# Patient Record
Sex: Female | Born: 1995 | Race: White | Hispanic: No | Marital: Single | Smoking: Never smoker
Health system: Southern US, Community
[De-identification: ages and names within clinical notes are randomized; demographics above are authoritative.]

## PROBLEM LIST (undated history)

## (undated) DIAGNOSIS — Z789 Other specified health status: Secondary | ICD-10-CM

## (undated) HISTORY — DX: Other specified health status: Z78.9

## (undated) HISTORY — PX: NO PAST SURGERIES: SHX2092

---

## 2020-02-15 NOTE — L&D Delivery Note (Signed)
Date of delivery: 10/17/2020 Estimated Date of Delivery: 10/31/20 Patient's last menstrual period was 01/25/2020. EGA: [redacted]w[redacted]d  Delivery Note At 9:17 AM a viable female was delivered via Vaginal, Spontaneous  Presentation:   Occiput Anterior, ROA APGAR: 8, 9;   Weight: 4030 g, 8 pounds 14 ounces Placenta status: Spontaneous, Intact.   Cord: 3 vessels with the following complications: None.  Cord pH: NA  Called to see patient.  Mom pushed to deliver a viable female infant.  The head followed by shoulders, which delivered without difficulty, and the rest of the body.  No nuchal cord noted.  Baby to mom's chest.  Cord clamped and cut after 3 min delay.  Cord blood obtained.  Placenta delivered spontaneously, intact, with a 3-vessel cord.  Second degree perineal laceration repaired with 3-0 Vicryl in standard fashion.  All counts correct.  Hemostasis obtained with IV pitocin and fundal massage.    Anesthesia: Epidural Episiotomy: None Lacerations: 2nd degree Suture Repair: 3.0 vicryl Est. Blood Loss (mL): 300  Mom to postpartum.  Baby to Couplet care / Skin to Skin.  Tresea Mall, CNM 10/17/2020, 10:08 AM

## 2020-04-21 ENCOUNTER — Ambulatory Visit (INDEPENDENT_AMBULATORY_CARE_PROVIDER_SITE_OTHER): Payer: Medicaid Other | Admitting: Obstetrics and Gynecology

## 2020-04-21 ENCOUNTER — Other Ambulatory Visit: Payer: Self-pay

## 2020-04-21 ENCOUNTER — Encounter: Payer: Self-pay | Admitting: Obstetrics and Gynecology

## 2020-04-21 VITALS — BP 120/70 | Ht 64.0 in | Wt 154.4 lb

## 2020-04-21 DIAGNOSIS — N760 Acute vaginitis: Secondary | ICD-10-CM | POA: Diagnosis not present

## 2020-04-21 DIAGNOSIS — R875 Abnormal microbiological findings in specimens from female genital organs: Secondary | ICD-10-CM | POA: Diagnosis not present

## 2020-04-21 DIAGNOSIS — Z3401 Encounter for supervision of normal first pregnancy, first trimester: Secondary | ICD-10-CM | POA: Diagnosis not present

## 2020-04-21 DIAGNOSIS — Z1379 Encounter for other screening for genetic and chromosomal anomalies: Secondary | ICD-10-CM

## 2020-04-21 DIAGNOSIS — Z3403 Encounter for supervision of normal first pregnancy, third trimester: Secondary | ICD-10-CM

## 2020-04-21 DIAGNOSIS — N926 Irregular menstruation, unspecified: Secondary | ICD-10-CM

## 2020-04-21 HISTORY — DX: Encounter for supervision of normal first pregnancy, third trimester: Z34.03

## 2020-04-21 LAB — POCT URINALYSIS DIPSTICK OB
Glucose, UA: NEGATIVE
POC,PROTEIN,UA: NEGATIVE

## 2020-04-21 LAB — POCT URINE PREGNANCY: Preg Test, Ur: POSITIVE — AB

## 2020-04-21 NOTE — Patient Instructions (Addendum)
Obstetrics: Normal and Problem Pregnancies (7th ed., pp. 102-121). [place], [place]: Elsevier."> Textbook of Family Medicine (9th ed., pp. [phone]). [place], [place]: Elsevier [employer].">  First Trimester of Pregnancy  The first trimester of pregnancy starts on the first day of your last menstrual period until the end of week 12. This is months 1 through 3 of pregnancy. A week after a sperm fertilizes an egg, the egg will implant into the wall of the uterus and begin to develop into a baby. By the end of 12 weeks, all the baby's organs will be formed and the baby will be 2-3 inches in size. Body changes during your first trimester Your body goes through many changes during pregnancy. The changes vary and generally return to normal after your baby is born. Physical changes  You may gain or lose weight.  Your breasts may begin to grow larger and become tender. The tissue that surrounds your nipples (areola) may become darker.  Dark spots or blotches (chloasma or mask of pregnancy) may develop on your face.  You may have changes in your hair. These can include thickening or thinning of your hair or changes in texture. Health changes  You may feel nauseous, and you may vomit.  You may have heartburn.  You may develop headaches.  You may develop constipation.  Your gums may bleed and may be sensitive to brushing and flossing. Other changes  You may tire easily.  You may urinate more often.  Your menstrual periods will stop.  You may have a loss of appetite.  You may develop cravings for certain kinds of food.  You may have changes in your emotions from day to day.  You may have more vivid and strange dreams. Follow these instructions at home: Medicines  Follow your health care provider's instructions regarding medicine use. Specific medicines may be either safe or unsafe to take during pregnancy. Do not take any medicines unless told to by your health care provider.  Take a  prenatal vitamin that contains at least 600 micrograms (mcg) of folic acid. Eating and drinking  Eat a healthy diet that includes fresh fruits and vegetables, whole grains, good sources of protein such as meat, eggs, or tofu, and low-fat dairy products.  Avoid raw meat and unpasteurized juice, milk, and cheese. These carry germs that can harm you and your baby.  If you feel nauseous or you vomit: ? Eat 4 or 5 small meals a day instead of 3 large meals. ? Try eating a few soda crackers. ? Drink liquids between meals instead of during meals.  You may need to take these actions to prevent or treat constipation: ? Drink enough fluid to keep your urine pale yellow. ? Eat foods that are high in fiber, such as beans, whole grains, and fresh fruits and vegetables. ? Limit foods that are high in fat and processed sugars, such as fried or sweet foods. Activity  Exercise only as directed by your health care provider. Most people can continue their usual exercise routine during pregnancy. Try to exercise for 30 minutes at least 5 days a week.  Stop exercising if you develop pain or cramping in the lower abdomen or lower back.  Avoid exercising if it is very hot or humid or if you are at high altitude.  Avoid heavy lifting.  If you choose to, you may have sex unless your health care provider tells you not to. Relieving pain and discomfort  Wear a good support bra to relieve breast  tenderness.  Rest with your legs elevated if you have leg cramps or low back pain.  If you develop bulging veins (varicose veins) in your legs: ? Wear support hose as told by your health care provider. ? Elevate your feet for 15 minutes, 3-4 times a day. ? Limit salt in your diet. Safety  Wear your seat belt at all times when driving or riding in a car.  Talk with your health care provider if someone is verbally or physically abusive to you.  Talk with your health care provider if you are feeling sad or have  thoughts of hurting yourself. Lifestyle  Do not use hot tubs, steam rooms, or saunas.  Do not douche. Do not use tampons or scented sanitary pads.  Do not use herbal remedies, alcohol, illegal drugs, or medicines that are not approved by your health care provider. Chemicals in these products can harm your baby.  Do not use any products that contain nicotine or tobacco, such as cigarettes, e-cigarettes, and chewing tobacco. If you need help quitting, ask your health care provider.  Avoid cat litter boxes and soil used by cats. These carry germs that can cause birth defects in the baby and possibly loss of the unborn baby (fetus) by miscarriage or stillbirth. General instructions  During routine prenatal visits in the first trimester, your health care provider will do a physical exam, perform necessary tests, and ask you how things are going. Keep all follow-up visits. This is important.  Ask for help if you have counseling or nutritional needs during pregnancy. Your health care provider can offer advice or refer you to specialists for help with various needs.  Schedule a dentist appointment. At home, brush your teeth with a soft toothbrush. Floss gently.  Write down your questions. Take them to your prenatal visits. Where to find more information  American Pregnancy Association: americanpregnancy.org  [employer]: https://www.todd-brady.net/  Office on [employer] [facility]: MightyReward.co.nz Contact a health care provider if you have:  Dizziness.  A fever.  Mild pelvic cramps, pelvic pressure, or nagging pain in the abdominal area.  Nausea, vomiting, or diarrhea that lasts for 24 hours or longer.  A bad-smelling vaginal discharge.  Pain when you urinate.  Known exposure to a contagious illness, such as chickenpox, measles, Zika virus, HIV, or hepatitis. Get help right away if you have:  Spotting or bleeding from your  vagina.  Severe abdominal cramping or pain.  Shortness of breath or chest pain.  Any kind of trauma, such as from a fall or a car crash.  New or increased pain, swelling, or redness in an arm or leg. Summary  The first trimester of pregnancy starts on the first day of your last menstrual period until the end of week 12 (months 1 through 3).  Eating 4 or 5 small meals a day rather than 3 large meals may help to relieve nausea and vomiting.  Do not use any products that contain nicotine or tobacco, such as cigarettes, e-cigarettes, and chewing tobacco. If you need help quitting, ask your health care provider.  Keep all follow-up visits. This is important. This information is not intended to replace advice given to you by your health care provider. Make sure you discuss any questions you have with your health care provider. Document Revised: 07/10/2019 Document Reviewed: 05/16/2019 Elsevier Patient Education  2021 Elsevier Inc.    Initial steps to help with nausea and vomiting in pregnancy:   B6 (pyridoxine) 25 mg,  3-4 times a day- 200 mg a day total Unisom (doxylamine) 25 mg at bedtime **B6 and Unisom are available as a combination prescription medications called diclegis and bonjesta  B1 (thiamin)  50-100 mg 1-2 a day-  100 mg a day total  Continue prenatal vitamin with iron and thiamin. If it is not tolerated switch to 1 mg of folic acid.  Can add medication for gastric reflux if needed.  Subsequent steps to be added to B1, B6, and Unisom:  1. Antihistamine (one of the following medications) Dramamine      25-50 mg every 4-6 hours Benadryl      25-50 mg every 4-6 hours Meclizine      25 mg every 6 hours  2. Dopamine Antagonist (one of the following medications) Metoclopramide  (Reglan)  5-10 mg every 6-8 hours         PO Promethazine   (Phenergan)   12.5-25 mg every 4-6 hours      PO or rectal Prochlorperazine  (Compazine)  5-10 mg every 6-8 hours     25mg  BID rectally    Subsequent steps if there has still not been improvement in symptoms:  3. Daily stool softner:  Colace 100 mg twice a day  4. Ondansetron  (Zofran)   4-8 mg every 6-8 hours     Hyperemesis Gravidarum Hyperemesis gravidarum is a severe form of nausea and vomiting that happens during pregnancy. Hyperemesis is worse than morning sickness. It may cause you to have nausea or vomiting all day for many days. It may keep you from eating and drinking enough food and liquids, which can lead to dehydration, malnutrition, and weight loss. Hyperemesis usually occurs during the first half (the first 20 weeks) of pregnancy. It often goes away once a woman is in her second half of pregnancy. However, sometimes hyperemesis continues through an entire pregnancy. What are the causes? The cause of this condition is not known. It may be associated with:  Changes in hormones in the body during pregnancy.  Changes in the gastrointestinal system.  Genetic or inherited conditions. What are the signs or symptoms? Symptoms of this condition include:  Severe nausea and vomiting that does not go away.  Problems keeping food down.  Weight loss.  Loss of body fluid (dehydration).  Loss of appetite. You may have no desire to eat or you may not like the food you have previously enjoyed. How is this diagnosed? This condition may be diagnosed based on your medical history, your symptoms, and a physical exam. You may also have other tests, including:  Blood tests.  Urine tests.  Blood pressure tests.  Ultrasound to look for problems with the placenta or to check if you are pregnant with more than one baby. How is this treated? This condition is managed by controlling symptoms. This may include:  Following an eating plan. This can help to lessen nausea and vomiting.  Treatments that do not use medicine. These include acupressure bracelets, hypnosis, and eating or drinking foods or fluids that contain  ginger, ginger ale, or ginger tea.  Taking prescription medicine or over-the-counter medicine as told by your health care provider.  Continuing to take prenatal vitamins. You may need to change what kind you take and when you take them. Follow your health care provider's instructions about prenatal vitamins. An eating plan and medicines are often used together to help control symptoms. If medicines do not help relieve nausea and vomiting, you may need to receive fluids through  an IV at the hospital. Follow these instructions at home: To help relieve your symptoms, listen to your body. Everyone is different and has different preferences. Find what works best for you. Here are some things you can try to help relieve your symptoms: Meals and snacks  Eat 5-6 small meals daily instead of 3 large meals. Eating small meals and snacks can help you avoid an empty stomach.  Before getting out of bed, eat a couple of crackers to avoid moving around on an empty stomach.  Eat a protein-rich snack before bed. Examples include cheese and crackers, or a peanut butter sandwich made with 1 slice of whole-wheat bread and 1 tsp (5 g) of peanut butter.  Eat and drink slowly.  Try eating starchy foods as these are usually tolerated well. Examples include cereal, toast, bread, potatoes, pasta, rice, and pretzels.  Eat at least one serving of protein with your meals and snacks. Protein options include lean meats, poultry, seafood, beans, nuts, nut butters, eggs, cheese, and yogurt.  Eat or suck on things that have ginger in them. It may help to relieve nausea. Add  tsp (0.44 g) ground ginger to hot tea, or choose ginger tea.   Fluids It is important to stay hydrated. Try to:  Drink small amounts of fluids often.  Drink fluids 30 minutes before or after a meal to help lessen the feeling of a full stomach.  Drink 100% fruit juice or an electrolyte drink. An electrolyte drink contains sodium, potassium, and  chloride.  Drink fluids that are cold, clear, and carbonated or sour. These include lemonade, ginger ale, lemon-lime soda, ice water, and sparkling water. Things to avoid Avoid the following:  Eating foods that trigger your symptoms. These may include spicy foods, coffee, high-fat foods, very sweet foods, and acidic foods.  Drinking more than 1 cup of fluid at a time.  Skipping meals. Nausea can be more intense on an empty stomach. If you cannot tolerate food, do not force it. Try sucking on ice chips or other frozen items and make up for missed calories later.  Lying down within 2 hours after eating.  Being exposed to environmental triggers. These may include food smells, smoky rooms, closed spaces, rooms with strong smells, warm or humid places, overly loud and noisy rooms, and rooms with motion or flickering lights. Try eating meals in a well-ventilated area that is free of strong smells.  Making quick and sudden changes in your movement.  Taking iron pills and multivitamins that contain iron. If you take prescription iron pills, do not stop taking them unless your health care provider approves.  Preparing food. The smell of food can spoil your appetite or trigger nausea. General instructions  Brush your teeth or use a mouth rinse after meals.  Take over-the-counter and prescription medicines only as told by your health care provider.  Follow instructions from your health care provider about eating or drinking restrictions.  Talk with your health care provider about starting a supplement of vitamin B6.  Continue to take your prenatal vitamins as told by your health care provider. If you are having trouble taking your prenatal vitamins, talk with your health care provider about other options.  Keep all follow-up visits. This is important. Follow-up visits include prenatal visits. Contact a health care provider if:  You have pain in your abdomen.  You have a severe  headache.  You have vision problems.  You are losing weight.  You feel weak or dizzy.  You cannot eat or drink without vomiting, especially if this goes on for a full day. Get help right away if:  You cannot drink fluids without vomiting.  You vomit blood.  You have constant nausea and vomiting.  You are very weak.  You faint.  You have a fever and your symptoms suddenly get worse. Summary  Hyperemesis gravidarum is a severe form of nausea and vomiting that happens during pregnancy.  Making some changes to your eating habits may help relieve nausea and vomiting.  This condition may be managed with lifestyle changes and medicines as prescribed by your health care provider.  If medicines do not help relieve nausea and vomiting, you may need to receive fluids through an IV at the hospital. This information is not intended to replace advice given to you by your health care provider. Make sure you discuss any questions you have with your health care provider. Document Revised: 08/26/2019 Document Reviewed: 08/26/2019 Elsevier Patient Education  2021 [employer]

## 2020-04-21 NOTE — Progress Notes (Signed)
04/21/2020   Chief Complaint: Missed period  Transfer of Care Patient: no  History of Present Illness: Vanessa Rodgers is a 25 y.o. [name] [redacted]w[redacted]d based on Patient's last menstrual period was 01/25/2020. with an Estimated Date of Delivery: 10/31/20, with the above CC.   Her periods were: regular periods everynomth She was using no method when she conceived.  She has Positive signs or symptoms of nausea/vomiting of pregnancy. She has Negative signs or symptoms of miscarriage or preterm labor She was not taking different medications around the time she conceived/early pregnancy. Since her LMP, she has not used alcohol Since her LMP, she has not used tobacco products Since her LMP, she has not used illegal drugs.    Current or past history of domestic violence. no  Infection History:  1. Since her LMP, she has had a viral illness.  Covid in early Jan 2022 2. She denies close contact with children on a regular basis     3. She has a history of chicken pox. She reports vaccination for chicken pox in the past. 4. Patient or partner has history of genital herpes  no 5. History of STI (GC, CT, HPV, syphilis, HIV)  no   6.  She does not live with someone with TB or TB exposed. 7. History of recent travel :  no 8. She identifies Negative Zika risk factors for her and her partner 51. There are not cats in the home in the home.  She understands that while pregnant she should not change cat litter.   Genetic Screening Questions: (Includes patient, baby's father, or anyone in either family)   1. Patient's age >/= 10 at [facility]  no 2. Thalassemia ([place], [place], Mediterranean, or Asian background): MCV<80  no 3. Neural tube defect (meningomyelocele, spina bifida, anencephaly)  no 4. Congenital heart defect  no  5. Down syndrome  yes 6. Tay-Sachs (Jewish, [place]  no 7. Canavan's Disease  no 8. Sickle cell disease or trait (African)  no  9. Hemophilia or other blood disorders  no  10. Muscular  dystrophy  no  11. Cystic fibrosis  no  12. Huntington's Chorea  no  13. Mental retardation/autism  no 14. Other inherited genetic or chromosomal disorder  no 15. Maternal metabolic disorder (DM, PKU, etc)  no 16. Patient or FOB with a child with a birth defect not listed above no  16a. Patient or FOB with a birth defect themselves no 17. Recurrent pregnancy loss, or stillbirth  no  18. Any medications since LMP other than prenatal vitamins (include vitamins, supplements, OTC meds, drugs, alcohol)  no 19. Any other genetic/environmental exposure to discuss  no  ROS:  ROS  OBGYN History: As per HPI. OB History  Gravida Para Term Preterm AB Living  1            SAB IAB Ectopic Multiple Live Births               # Outcome Date GA Lbr Len/2nd Weight Sex Delivery Anes PTL Lv  1 Current             Any issues with any prior pregnancies: not applicable Any prior children are healthy, doing well, without any problems or issues: not applicable Last pap smear 02/21/2019- ASCUS HPV+, then 02/20/2020 LSIL  History of STIs: No   Past Medical History: History reviewed. No pertinent past medical history.  Past Surgical History: History reviewed. No pertinent surgical history.  Family History:  Family History  Problem Relation Age of Onset  . Angina Father    She denies any female cancers, bleeding or blood clotting disorders.   Social History:  Social History   Socioeconomic History  . Marital status: Single    Spouse name: Not on file  . Number of children: Not on file  . Years of education: Not on file  . Highest education level: Not on file  Occupational History  . Not on file  Tobacco Use  . Smoking status: Never Smoker  . Smokeless tobacco: Never Used  Vaping Use  . Vaping Use: Never used  Substance and Sexual Activity  . Alcohol use: Not Currently  . Drug use: Never  . Sexual activity: Yes    Partners: Male  Other Topics Concern  . Not on file  Social History  Narrative  . Not on file   Social Determinants of Health   Financial Resource Strain: Not on file  Food Insecurity: Not on file  Transportation Needs: Not on file  Physical Activity: Not on file  Stress: Not on file  Social Connections: Not on file  Intimate Partner Violence: Not on file    Allergy: No Known Allergies  Current Outpatient Medications: No current outpatient medications on file.   Physical Exam: Physical Exam   Assessment: Vanessa Rodgers is a 25 y.o. [name] [redacted]w[redacted]d based on Patient's last menstrual period was 01/25/2020. with an Estimated Date of Delivery: 10/31/20,  for prenatal care.  Plan:  1) Avoid alcoholic beverages. 2) Patient encouraged not to smoke.  3) Discontinue the use of all non-medicinal drugs and chemicals.  4) Take prenatal vitamins daily.  5) Seatbelt use advised 6) Nutrition, food safety (fish, cheese advisories, and high nitrite foods) and exercise discussed. 7) Hospital and practice style delivering at [facility] discussed  8) Patient is asked about travel to areas at risk for the Zika virus, and counseled to avoid travel and exposure to mosquitoes or sexual partners who may have themselves been exposed to the virus. Testing is discussed, and will be ordered as appropriate.  9) Childbirth classes at [facility] advised 10) Genetic Screening, such as with 1st Trimester Screening, cell free fetal DNA, AFP testing, and Ultrasound, as well as with amniocentesis and CVS as appropriate, is discussed with patient. She plans to have genetic testing this pregnancy.  Bedside abdominal US today confirmed IUP with + FHT  Problem list reviewed and updated.  I discussed the assessment and treatment plan with the patient. The patient was provided an opportunity to ask questions and all were answered. The patient agreed with the plan and demonstrated an understanding of the instructions.  Adelene Idler MD [facility], [facility] 04/21/2020 10:46  AM

## 2020-04-22 LAB — HEPATITIS C ANTIBODY: Hep C Virus Ab: <0.1 s/co ratio (ref 0.0–0.9)

## 2020-04-22 LAB — RPR+RH+ABO+RUB AB+AB SCR+CB...
Antibody Screen: NEGATIVE
HIV Screen 4th Generation wRfx: NONREACTIVE
Hematocrit: 38.1 % (ref 34.0–46.6)
Hemoglobin: 13.3 g/dL (ref 11.1–15.9)
Hepatitis B Surface Ag: NEGATIVE
MCH: 33 pg (ref 26.6–33.0)
MCHC: 34.9 g/dL (ref 31.5–35.7)
MCV: 95 fL (ref 79–97)
Platelets: 228 10*3/uL (ref 150–450)
RBC: 4.03 x10E6/uL (ref 3.77–5.28)
RDW: 12.4 % (ref 11.7–15.4)
RPR Ser Ql: NONREACTIVE
Rh Factor: NEGATIVE
Rubella Antibodies, IGG: <0.9 index — ABNORMAL LOW (ref >0.99)
Varicella zoster IgG: <135 index — ABNORMAL LOW (ref >165)
WBC: 6.2 10*3/uL (ref 3.4–10.8)

## 2020-04-22 LAB — MONITOR DRUG PROFILE 10(MW)
Amphetamine Scrn, Ur: NEGATIVE ng/mL
BARBITURATE SCREEN URINE: NEGATIVE ng/mL
BENZODIAZEPINE SCREEN, URINE: NEGATIVE ng/mL
CANNABINOIDS UR QL SCN: NEGATIVE ng/mL
Cocaine (Metab) Scrn, Ur: NEGATIVE ng/mL
Creatinine(Crt), U: 239.3 mg/dL (ref 20.0–300.0)
Methadone Screen, Urine: NEGATIVE ng/mL
OXYCODONE+OXYMORPHONE UR QL SCN: NEGATIVE ng/mL
Opiate Scrn, Ur: NEGATIVE ng/mL
Ph of Urine: 6.7 (ref 4.5–8.9)
Phencyclidine Qn, Ur: NEGATIVE ng/mL
Propoxyphene Scrn, Ur: NEGATIVE ng/mL

## 2020-04-24 LAB — NUSWAB VAGINITIS PLUS (VG+)
Candida albicans, NAA: NEGATIVE
Candida glabrata, NAA: NEGATIVE
Chlamydia trachomatis, NAA: NEGATIVE
Megasphaera 1: HIGH Score — AB
Neisseria gonorrhoeae, NAA: NEGATIVE
Trich vag by NAA: NEGATIVE

## 2020-04-24 LAB — URINE CULTURE

## 2020-04-26 LAB — MATERNIT21 PLUS CORE+SCA
Fetal Fraction: 8
Monosomy X (Turner Syndrome): NOT DETECTED
Result (T21): NEGATIVE
Trisomy 13 (Patau syndrome): NEGATIVE
Trisomy 18 (Edwards syndrome): NEGATIVE
Trisomy 21 (Down syndrome): NEGATIVE
XXX (Triple X Syndrome): NOT DETECTED
XXY (Klinefelter Syndrome): NOT DETECTED
XYY (Jacobs Syndrome): NOT DETECTED

## 2020-04-27 ENCOUNTER — Other Ambulatory Visit: Payer: Self-pay | Admitting: Obstetrics and Gynecology

## 2020-04-27 DIAGNOSIS — B9689 Other specified bacterial agents as the cause of diseases classified elsewhere: Secondary | ICD-10-CM

## 2020-04-27 DIAGNOSIS — N760 Acute vaginitis: Secondary | ICD-10-CM

## 2020-04-27 MED ORDER — METRONIDAZOLE 0.75 % VA GEL: 1.0000 | Freq: Every day | VAGINAL | 1 refills | Status: AC

## 2020-05-26 ENCOUNTER — Ambulatory Visit (INDEPENDENT_AMBULATORY_CARE_PROVIDER_SITE_OTHER): Payer: Medicaid Other | Admitting: Obstetrics & Gynecology

## 2020-05-26 ENCOUNTER — Encounter: Payer: Self-pay | Admitting: Obstetrics & Gynecology

## 2020-05-26 ENCOUNTER — Other Ambulatory Visit: Payer: Self-pay

## 2020-05-26 VITALS — BP 120/80 | Wt 160.0 lb

## 2020-05-26 DIAGNOSIS — Z3689 Encounter for other specified antenatal screening: Secondary | ICD-10-CM

## 2020-05-26 DIAGNOSIS — Z3402 Encounter for supervision of normal first pregnancy, second trimester: Secondary | ICD-10-CM

## 2020-05-26 DIAGNOSIS — Z3A17 17 weeks gestation of pregnancy: Secondary | ICD-10-CM

## 2020-05-26 LAB — POCT URINALYSIS DIPSTICK OB
Glucose, UA: NEGATIVE
POC,PROTEIN,UA: NEGATIVE

## 2020-05-26 NOTE — Addendum Note (Signed)
Addended by: Cornelius Moras D on: 05/26/2020 09:48 AM   Modules accepted: Orders

## 2020-05-26 NOTE — Patient Instructions (Signed)
Thank you for choosing [facility]. As part of our ongoing efforts to improve patient experience, we would appreciate your feedback. Please fill out the short survey that you will receive by mail or MyChart. Your opinion is important to us! -Dr Srihitha Tagliaferri  Second Trimester of Pregnancy  The second trimester of pregnancy is from week 13 through week 27. This is months 4 through 6 of pregnancy. The second trimester is often a time when you feel your best. Your body has adjusted to being pregnant, and you begin to feel better physically. During the second trimester:  Morning sickness has lessened or stopped completely.  You may have more energy.  You may have an increase in appetite. The second trimester is also a time when the unborn baby (fetus) is growing rapidly. At the end of the sixth month, the fetus may be up to 12 inches long and weigh about 1 pounds. You will likely begin to feel the baby move (quickening) between 16 and 20 weeks of pregnancy. Body changes during your second trimester Your body continues to go through many changes during your second trimester. The changes vary and generally return to normal after the baby is born. Physical changes  Your weight will continue to increase. You will notice your lower abdomen bulging out.  You may begin to get stretch marks on your hips, abdomen, and breasts.  Your breasts will continue to grow and to become tender.  Dark spots or blotches (chloasma or mask of pregnancy) may develop on your face.  A dark line from your belly button to the pubic area (linea nigra) may appear.  You may have changes in your hair. These can include thickening of your hair, rapid growth, and changes in texture. Some people also have hair loss during or after pregnancy, or hair that feels dry or thin. Health changes  You may develop headaches.  You may have heartburn.  You may develop constipation.  You may develop hemorrhoids or swollen, bulging  veins (varicose veins).  Your gums may bleed and may be sensitive to brushing and flossing.  You may urinate more often because the fetus is pressing on your bladder.  You may have back pain. This is caused by: ? Weight gain. ? Pregnancy hormones that are relaxing the joints in your pelvis. ? A shift in weight and the muscles that support your balance. Follow these instructions at home: Medicines  Follow your health care provider's instructions regarding medicine use. Specific medicines may be either safe or unsafe to take during pregnancy. Do not take any medicines unless approved by your health care provider.  Take a prenatal vitamin that contains at least 600 micrograms (mcg) of folic acid. Eating and drinking  Eat a healthy diet that includes fresh fruits and vegetables, whole grains, good sources of protein such as meat, eggs, or tofu, and low-fat dairy products.  Avoid raw meat and unpasteurized juice, milk, and cheese. These carry germs that can harm you and your baby.  You may need to take these actions to prevent or treat constipation: ? Drink enough fluid to keep your urine pale yellow. ? Eat foods that are high in fiber, such as beans, whole grains, and fresh fruits and vegetables. ? Limit foods that are high in fat and processed sugars, such as fried or sweet foods. Activity  Exercise only as directed by your health care provider. Most people can continue their usual exercise routine during pregnancy. Try to exercise for 30 minutes at least   5 days a week. Stop exercising if you develop contractions in your uterus.  Stop exercising if you develop pain or cramping in the lower abdomen or lower back.  Avoid exercising if it is very hot or humid or if you are at a high altitude.  Avoid heavy lifting.  If you choose to, you may have sex unless your health care provider tells you not to. Relieving pain and discomfort  Wear a supportive bra to prevent discomfort from  breast tenderness.  Take warm sitz baths to soothe any pain or discomfort caused by hemorrhoids. Use hemorrhoid cream if your health care provider approves.  Rest with your legs raised (elevated) if you have leg cramps or low back pain.  If you develop varicose veins: ? Wear support hose as told by your health care provider. ? Elevate your feet for 15 minutes, 3-4 times a day. ? Limit salt in your diet. Safety  Wear your seat belt at all times when driving or riding in a car.  Talk with your health care provider if someone is verbally or physically abusive to you. Lifestyle  Do not use hot tubs, steam rooms, or saunas.  Do not douche. Do not use tampons or scented sanitary pads.  Avoid cat litter boxes and soil used by cats. These carry germs that can cause birth defects in the baby and possibly loss of the fetus by miscarriage or stillbirth.  Do not use herbal remedies, alcohol, illegal drugs, or medicines that are not approved by your health care provider. Chemicals in these products can harm your baby.  Do not use any products that contain nicotine or tobacco, such as cigarettes, e-cigarettes, and chewing tobacco. If you need help quitting, ask your health care provider. General instructions  During a routine prenatal visit, your health care provider will do a physical exam and other tests. He or she will also discuss your overall health. Keep all follow-up visits. This is important.  Ask your health care provider for a referral to a local prenatal education class.  Ask for help if you have counseling or nutritional needs during pregnancy. Your health care provider can offer advice or refer you to specialists for help with various needs. Where to find more information  American Pregnancy Association: americanpregnancy.org  American College of Obstetricians and Gynecologists: acog.org/en/Womens%20Health/Pregnancy  Office on Women's Health: womenshealth.gov/pregnancy Contact  a health care provider if you have:  A headache that does not go away when you take medicine.  Vision changes or you see spots in front of your eyes.  Mild pelvic cramps, pelvic pressure, or nagging pain in the abdominal area.  Persistent nausea, vomiting, or diarrhea.  A bad-smelling vaginal discharge or foul-smelling urine.  Pain when you urinate.  Sudden or extreme swelling of your face, hands, ankles, feet, or legs.  A fever. Get help right away if you:  Have fluid leaking from your vagina.  Have spotting or bleeding from your vagina.  Have severe abdominal cramping or pain.  Have difficulty breathing.  Have chest pain.  Have fainting spells.  Have not felt your baby move for the time period told by your health care provider.  Have new or increased pain, swelling, or redness in an arm or leg. Summary  The second trimester of pregnancy is from week 13 through week 27 (months 4 through 6).  Do not use herbal remedies, alcohol, illegal drugs, or medicines that are not approved by your health care provider. Chemicals in these products can   harm your baby.  Exercise only as directed by your health care provider. Most people can continue their usual exercise routine during pregnancy.  Keep all follow-up visits. This is important. This information is not intended to replace advice given to you by your health care provider. Make sure you discuss any questions you have with your health care provider. Document Revised: 07/10/2019 Document Reviewed: 05/16/2019 Elsevier Patient Education  2021 Elsevier Inc.  

## 2020-05-26 NOTE — Progress Notes (Signed)
  Subjective  No pain or nausea  Objective  BP 120/80   Wt 160 lb (72.6 kg)   LMP 01/25/2020   BMI 27.46 kg/m  General: NAD Pumonary: no increased work of breathing Abdomen: gravid, non-tender Extremities: no edema Psychiatric: mood appropriate, affect full [place] for FHTs 150s, ant placenta, good FM seen Assessment  25 y.o. [name] by  10/31/2020, by Last Menstrual Period presenting for routine prenatal visit  Plan   Problem List Items Addressed This Visit      Other   Encounter for supervision of normal first pregnancy in first trimester    Other Visit Diagnoses    [redacted] weeks gestation of pregnancy    -  Primary   Screening, antenatal, for fetal anatomic survey       Relevant Orders   US OB Comp + 14 Wk    PNV Colpo for abn PAP discussed and scheduled [place] for anat check to be arranged   Annamarie Major, MD, [facility], [facility] 05/26/2020  9:41 AM

## 2020-06-23 ENCOUNTER — Encounter: Payer: Self-pay | Admitting: Obstetrics and Gynecology

## 2020-06-23 ENCOUNTER — Ambulatory Visit (INDEPENDENT_AMBULATORY_CARE_PROVIDER_SITE_OTHER): Payer: Medicaid Other | Admitting: Obstetrics and Gynecology

## 2020-06-23 ENCOUNTER — Other Ambulatory Visit: Payer: Self-pay

## 2020-06-23 VITALS — BP 122/74 | Wt 170.0 lb

## 2020-06-23 DIAGNOSIS — Z3401 Encounter for supervision of normal first pregnancy, first trimester: Secondary | ICD-10-CM

## 2020-06-23 DIAGNOSIS — Z3402 Encounter for supervision of normal first pregnancy, second trimester: Secondary | ICD-10-CM

## 2020-06-23 DIAGNOSIS — Z3A21 21 weeks gestation of pregnancy: Secondary | ICD-10-CM

## 2020-06-23 NOTE — Progress Notes (Signed)
Routine Prenatal Care Visit  Subjective  Vanessa Rodgers is a 25 y.o. [name] being seen today for ongoing prenatal care.  She is currently monitored for the following issues for this low-risk pregnancy and has Encounter for supervision of normal first pregnancy in first trimester on their problem list.  ----------------------------------------------------------------------------------- Patient reports no complaints.   Contractions: Not present. Vag. Bleeding: None.  Movement: Present. Leaking Fluid denies.  ----------------------------------------------------------------------------------- The following portions of the patient's history were reviewed and updated as appropriate: allergies, current medications, past family history, past medical history, past social history, past surgical history and problem list. Problem list updated.  Objective  Blood pressure 122/74, weight 170 lb (77.1 kg), last menstrual period 01/25/2020. Pregravid weight 148 lb (67.1 kg) Total Weight Gain 22 lb (9.979 kg) Urinalysis: Urine Protein    Urine Glucose    Fetal Status: Fetal Heart Rate (bpm): 135   Movement: Present     General:  Alert, oriented and cooperative. Patient is in no acute distress.  Skin: Skin is warm and dry. No rash noted.   Cardiovascular: Normal heart rate noted  Respiratory: Normal respiratory effort, no problems with respiration noted  Abdomen: Soft, gravid, appropriate for gestational age. Pain/Pressure: Absent     Pelvic:  Cervical exam deferred        Extremities: Normal range of motion.     Mental Status: Normal mood and affect. Normal behavior. Normal judgment and thought content.   Assessment   25 y.o. [name] by  10/31/2020, by Last Menstrual Period presenting for routine prenatal visit  Plan   pregnancy 1 Problems (from 04/21/20 to present)    Problem Noted Resolved   Encounter for supervision of normal first pregnancy in first trimester 04/21/2020 by Natale Milch, MD No   Overview Addendum 06/23/2020 10:14 AM by Conard Novak, MD     Nursing Staff Provider  Office Location  [facility] Dating  [place] 13 weeks  Language  English Anatomy US    Flu Vaccine  2021 Genetic Screen  NIPS: nml XY  TDaP vaccine    Hgb A1C or  GTT Third trimester :   Covid Vaccinated Had covid after vaccination jan 2022   LAB RESULTS   Rhogam   Blood Type A/Negative/-- (03/08 1101)   Feeding Plan  Antibody Negative (03/08 1101)  Contraception  Rubella <0.90 (03/08 1101)  Circumcision  RPR Non Reactive (03/08 1101)   Pediatrician   HBsAg Negative (03/08 1101)   Support Person  HIV Non Reactive (03/08 1101)  Prenatal Classes  Varicella NON IMMUNE    GBS  (For PCN allergy, check sensitivities)   BTL Consent no    VBAC Consent n/a Pap  LGSIL 02/2020, ASCUS 2021 - repeat pap  In 2023 per [facility]P    Colpo           Previous Version       Preterm labor symptoms and general obstetric precautions including but not limited to vaginal bleeding, contractions, leaking of fluid and fetal movement were reviewed in detail with the patient. Please refer to After Visit Summary for other counseling recommendations.   - per ASCCP guidelines, ASCUS/HPV+ followed by LGSIL pap smear, recommend pap smear in one year. If abnormal at that time, then perform colposcopy. So, no colposcopy performed today.  Discussed all this with patient and she feels comfortable with this plan.   Return in about 4 weeks (around 07/21/2020) for Routine Prenatal Appointment.   Thomasene Mohair,  MD, [facility], [facility] 06/23/2020 10:14 AM

## 2020-07-20 ENCOUNTER — Other Ambulatory Visit: Payer: Self-pay

## 2020-07-20 ENCOUNTER — Ambulatory Visit
Admission: RE | Admit: 2020-07-20 | Discharge: 2020-07-20 | Disposition: A | Discharge status: Home / Self Care | Payer: Medicaid Other | Source: Ambulatory Visit | Attending: Obstetrics & Gynecology | Admitting: Obstetrics & Gynecology

## 2020-07-20 DIAGNOSIS — Z3689 Encounter for other specified antenatal screening: Secondary | ICD-10-CM | POA: Diagnosis present

## 2020-07-21 ENCOUNTER — Ambulatory Visit (INDEPENDENT_AMBULATORY_CARE_PROVIDER_SITE_OTHER): Payer: Medicaid Other | Admitting: Obstetrics and Gynecology

## 2020-07-21 ENCOUNTER — Encounter: Payer: Self-pay | Admitting: Obstetrics and Gynecology

## 2020-07-21 VITALS — BP 98/68 | Ht 64.0 in | Wt 179.6 lb

## 2020-07-21 DIAGNOSIS — Z3402 Encounter for supervision of normal first pregnancy, second trimester: Secondary | ICD-10-CM

## 2020-07-21 DIAGNOSIS — Z3401 Encounter for supervision of normal first pregnancy, first trimester: Secondary | ICD-10-CM

## 2020-07-21 DIAGNOSIS — Z3A25 25 weeks gestation of pregnancy: Secondary | ICD-10-CM

## 2020-07-21 NOTE — Patient Instructions (Signed)

## 2020-07-21 NOTE — Progress Notes (Signed)
    Routine Prenatal Care Visit  Subjective  Vanessa Rodgers is a 25 y.o. [name] being seen today for ongoing prenatal care.  She is currently monitored for the following issues for this low-risk pregnancy and has Encounter for supervision of normal first pregnancy in first trimester on their problem list.  ----------------------------------------------------------------------------------- Patient reports no complaints.   Contractions: Not present. Vag. Bleeding: None.  Movement: Present. Denies leaking of fluid.  ----------------------------------------------------------------------------------- The following portions of the patient's history were reviewed and updated as appropriate: allergies, current medications, past family history, past medical history, past social history, past surgical history and problem list. Problem list updated.   Objective  Blood pressure 98/68, height 5\' 4"  (1.626 m), weight 179 lb 9.6 oz (81.5 kg), last menstrual period 01/25/2020. Pregravid weight 148 lb (67.1 kg) Total Weight Gain 31 lb 9.6 oz (14.3 kg) Urinalysis:      Fetal Status: Fetal Heart Rate (bpm): 140 Fundal Height: 25 cm Movement: Present     General:  Alert, oriented and cooperative. Patient is in no acute distress.  Skin: Skin is warm and dry. No rash noted.   Cardiovascular: Normal heart rate noted  Respiratory: Normal respiratory effort, no problems with respiration noted  Abdomen: Soft, gravid, appropriate for gestational age. Pain/Pressure: Absent     Pelvic:  Cervical exam deferred        Extremities: Normal range of motion.  Edema: None  Mental Status: Normal mood and affect. Normal behavior. Normal judgment and thought content.     Assessment   25 y.o. [name] by  10/31/2020, by Last Menstrual Period presenting for routine prenatal visit  Plan   pregnancy 1 Problems (from 04/21/20 to present)    Problem Noted Resolved   Encounter for supervision of normal first  pregnancy in first trimester 04/21/2020 by 06/21/2020, MD No   Overview Addendum 07/21/2020 11:36 AM by 09/20/2020, MD     Nursing Staff Provider  Office Location  [facility] Dating  Natale Milch 13 weeks  Language  English Anatomy US    Flu Vaccine  2021 Genetic Screen  NIPS: nml XY  TDaP vaccine    Hgb A1C or  GTT Third trimester :   Covid Vaccinated Had covid after vaccination jan 2022   LAB RESULTS   Rhogam   Blood Type A/Negative/-- (03/08 1101)   Feeding Plan breast Antibody Negative (03/08 1101)  Contraception pills Rubella <0.90 (03/08 1101)  Circumcision  RPR Non Reactive (03/08 1101)   Pediatrician   HBsAg Negative (03/08 1101)   Support Person 09-03-1987- FOB HIV Non Reactive (03/08 1101)  Prenatal Classes Discussed Varicella NON IMMUNE    GBS  (For PCN allergy, check sensitivities)   BTL Consent no    VBAC Consent n/a Pap  LGSIL 02/2020, ASCUS 2021 - repeat pap  In 2023 per [facility]P    Colpo           Previous Version       Gestational age appropriate obstetric precautions including but not limited to vaginal bleeding, contractions, leaking of fluid and fetal movement were reviewed in detail with the patient.    Return in about 3 weeks (around 08/11/2020) for 2-3wk ROB and 1 GTT.  08/13/2020 MD [facility], [facility] 07/21/2020, 11:39 AM

## 2020-08-10 ENCOUNTER — Other Ambulatory Visit: Payer: Medicaid Other

## 2020-08-10 ENCOUNTER — Encounter: Payer: Self-pay | Admitting: Obstetrics and Gynecology

## 2020-08-10 ENCOUNTER — Other Ambulatory Visit: Payer: Self-pay

## 2020-08-10 ENCOUNTER — Ambulatory Visit (INDEPENDENT_AMBULATORY_CARE_PROVIDER_SITE_OTHER): Payer: Medicaid Other | Admitting: Obstetrics and Gynecology

## 2020-08-10 VITALS — BP 114/70 | Ht 64.0 in | Wt 185.4 lb

## 2020-08-10 DIAGNOSIS — Z3A28 28 weeks gestation of pregnancy: Secondary | ICD-10-CM | POA: Diagnosis not present

## 2020-08-10 DIAGNOSIS — Z3403 Encounter for supervision of normal first pregnancy, third trimester: Secondary | ICD-10-CM

## 2020-08-10 DIAGNOSIS — O360130 Maternal care for anti-D [Rh] antibodies, third trimester, not applicable or unspecified: Secondary | ICD-10-CM

## 2020-08-10 DIAGNOSIS — Z3402 Encounter for supervision of normal first pregnancy, second trimester: Secondary | ICD-10-CM

## 2020-08-10 DIAGNOSIS — O26899 Other specified pregnancy related conditions, unspecified trimester: Secondary | ICD-10-CM | POA: Insufficient documentation

## 2020-08-10 DIAGNOSIS — O360930 Maternal care for other rhesus isoimmunization, third trimester, not applicable or unspecified: Secondary | ICD-10-CM

## 2020-08-10 DIAGNOSIS — Z23 Encounter for immunization: Secondary | ICD-10-CM | POA: Diagnosis not present

## 2020-08-10 DIAGNOSIS — O26893 Other specified pregnancy related conditions, third trimester: Secondary | ICD-10-CM

## 2020-08-10 DIAGNOSIS — Z6791 Unspecified blood type, Rh negative: Secondary | ICD-10-CM

## 2020-08-10 LAB — POCT URINALYSIS DIPSTICK OB
Glucose, UA: NEGATIVE
POC,PROTEIN,UA: NEGATIVE

## 2020-08-10 MED ORDER — RHO D IMMUNE GLOBULIN 1500 UNIT/2ML IJ SOSY
300.0000 ug | PREFILLED_SYRINGE | Freq: Once | INTRAMUSCULAR | Status: AC
  Administered 2020-08-10: 300 ug via INTRAMUSCULAR

## 2020-08-10 NOTE — Addendum Note (Signed)
Addended by: Clement Husbands A on: 08/10/2020 12:20 PM   Modules accepted: Orders

## 2020-08-10 NOTE — Progress Notes (Signed)
Routine Prenatal Care Visit  Subjective  Vanessa Rodgers is a 25 y.o. [name] being seen today for ongoing prenatal care.  She is currently monitored for the following issues for this low-risk pregnancy and has Encounter for supervision of normal first pregnancy in first trimester and Rh negative status during pregnancy on their problem list.  ----------------------------------------------------------------------------------- Patient reports no complaints.   Contractions: Irritability. Vag. Bleeding: None.  Movement: Present. Leaking Fluid denies.  ----------------------------------------------------------------------------------- The following portions of the patient's history were reviewed and updated as appropriate: allergies, current medications, past family history, past medical history, past social history, past surgical history and problem list. Problem list updated.  Objective  Blood pressure 114/70, height 5\' 4"  (1.626 m), weight 185 lb 6.4 oz (84.1 kg), last menstrual period 01/25/2020. Pregravid weight 148 lb (67.1 kg) Total Weight Gain 37 lb 6.4 oz (17 kg) Urinalysis: Urine Protein Negative  Urine Glucose Negative  Fetal Status: Fetal Heart Rate (bpm): 140 Fundal Height: 28 cm Movement: Present     General:  Alert, oriented and cooperative. Patient is in no acute distress.  Skin: Skin is warm and dry. No rash noted.   Cardiovascular: Normal heart rate noted  Respiratory: Normal respiratory effort, no problems with respiration noted  Abdomen: Soft, gravid, appropriate for gestational age. Pain/Pressure: Absent     Pelvic:  Cervical exam deferred        Extremities: Normal range of motion.  Edema: None  Mental Status: Normal mood and affect. Normal behavior. Normal judgment and thought content.   Assessment   25 y.o. [name] by  10/31/2020, by Last Menstrual Period presenting for routine prenatal visit  Plan   pregnancy 1 Problems (from 04/21/20 to present)      Problem Noted Resolved   Rh negative status during pregnancy 08/10/2020 by 08/12/2020, MD No   Encounter for supervision of normal first pregnancy in first trimester 04/21/2020 by 06/21/2020, MD No   Overview Addendum 07/21/2020 11:36 AM by 09/20/2020, MD     Nursing Staff Provider  Office Location  [facility] Dating  Natale Milch 13 weeks  Language  English Anatomy US    Flu Vaccine  2021 Genetic Screen  NIPS: nml XY  TDaP vaccine    Hgb A1C or  GTT Third trimester :   Covid Vaccinated Had covid after vaccination jan 2022   LAB RESULTS   Rhogam   Blood Type A/Negative/-- (03/08 1101)   Feeding Plan breast Antibody Negative (03/08 1101)  Contraception pills Rubella <0.90 (03/08 1101)  Circumcision  RPR Non Reactive (03/08 1101)   Pediatrician   HBsAg Negative (03/08 1101)   Support Person 09-03-1987- FOB HIV Non Reactive (03/08 1101)  Prenatal Classes Discussed Varicella NON IMMUNE    GBS  (For PCN allergy, check sensitivities)   BTL Consent no    VBAC Consent n/a Pap  LGSIL 02/2020, ASCUS 2021 - repeat pap  In 2023 per [facility]P    Colpo                 Preterm labor symptoms and general obstetric precautions including but not limited to vaginal bleeding, contractions, leaking of fluid and fetal movement were reviewed in detail with the patient. Please refer to After Visit Summary for other counseling recommendations.   - 28 week labs today - rhogam today  Return in about 2 weeks (around 08/24/2020) for Routine Prenatal Appointment.   10/25/2020, MD, [facility], [facility] 08/10/2020 12:10 PM

## 2020-08-11 LAB — 28 WEEKS RH-PANEL
Antibody Screen: NEGATIVE
Basophils Absolute: 0 10*3/uL (ref 0.0–0.2)
Basos: 0 %
EOS (ABSOLUTE): 0.1 10*3/uL (ref 0.0–0.4)
Eos: 1 %
Gestational Diabetes Screen: 111 mg/dL (ref 65–139)
HIV Screen 4th Generation wRfx: NONREACTIVE
Hematocrit: 32.5 % — ABNORMAL LOW (ref 34.0–46.6)
Hemoglobin: 11.2 g/dL (ref 11.1–15.9)
Immature Grans (Abs): 0.1 10*3/uL (ref 0.0–0.1)
Immature Granulocytes: 1 %
Lymphocytes Absolute: 1.3 10*3/uL (ref 0.7–3.1)
Lymphs: 15 %
MCH: 32.3 pg (ref 26.6–33.0)
MCHC: 34.5 g/dL (ref 31.5–35.7)
MCV: 94 fL (ref 79–97)
Monocytes Absolute: 0.7 10*3/uL (ref 0.1–0.9)
Monocytes: 9 %
Neutrophils Absolute: 6.3 10*3/uL (ref 1.4–7.0)
Neutrophils: 74 %
Platelets: 260 10*3/uL (ref 150–450)
RBC: 3.47 x10E6/uL — ABNORMAL LOW (ref 3.77–5.28)
RDW: 11.9 % (ref 11.7–15.4)
RPR Ser Ql: NONREACTIVE
WBC: 8.6 10*3/uL (ref 3.4–10.8)

## 2020-08-14 ENCOUNTER — Telehealth: Payer: Self-pay

## 2020-08-14 NOTE — Telephone Encounter (Signed)
Irving Burton from [facility] calling; rxs for pt's compression stockings and pregnancy support band do not have dx codes on them.  Please refax c dx codes to [phone] or call [phone] to give tham verbally.

## 2020-08-21 NOTE — Telephone Encounter (Signed)
Contacted [facility] to give verbal diagnosis (pregnancy). They state her insurance does not accept pregnancy as a covered diagnosis. They will refax the form. If it is not medically necessary, advised for Dr. Jean Rosenthal to mark denied and return by fax.

## 2020-08-24 ENCOUNTER — Ambulatory Visit (INDEPENDENT_AMBULATORY_CARE_PROVIDER_SITE_OTHER): Payer: Medicaid Other | Admitting: Obstetrics and Gynecology

## 2020-08-24 ENCOUNTER — Encounter: Payer: Self-pay | Admitting: Obstetrics and Gynecology

## 2020-08-24 ENCOUNTER — Other Ambulatory Visit: Payer: Self-pay

## 2020-08-24 VITALS — BP 120/70 | Wt 188.0 lb

## 2020-08-24 DIAGNOSIS — Z3403 Encounter for supervision of normal first pregnancy, third trimester: Secondary | ICD-10-CM

## 2020-08-24 DIAGNOSIS — O26893 Other specified pregnancy related conditions, third trimester: Secondary | ICD-10-CM

## 2020-08-24 DIAGNOSIS — Z6791 Unspecified blood type, Rh negative: Secondary | ICD-10-CM

## 2020-08-24 DIAGNOSIS — Z3A30 30 weeks gestation of pregnancy: Secondary | ICD-10-CM

## 2020-08-24 NOTE — Progress Notes (Signed)
Routine Prenatal Care Visit  Subjective  Vanessa Rodgers is a 25 y.o. [name] being seen today for ongoing prenatal care.  She is currently monitored for the following issues for this low-risk pregnancy and has Encounter for supervision of normal first pregnancy in first trimester and Rh negative status during pregnancy on their problem list.  ----------------------------------------------------------------------------------- Patient reports uterine irritability.  Denies urinary, vaginal ,and GI sx.  Contractions: Irritability. Vag. Bleeding: None.  Movement: Present. Leaking Fluid denies.  ----------------------------------------------------------------------------------- The following portions of the patient's history were reviewed and updated as appropriate: allergies, current medications, past family history, past medical history, past social history, past surgical history and problem list. Problem list updated.  Objective  Blood pressure 120/70, weight 188 lb (85.3 kg), last menstrual period 01/25/2020. Pregravid weight 148 lb (67.1 kg) Total Weight Gain 40 lb (18.1 kg) Urinalysis: Urine Protein    Urine Glucose    Fetal Status: Fetal Heart Rate (bpm): 145 Fundal Height: 30 cm Movement: Present     General:  Alert, oriented and cooperative. Patient is in no acute distress.  Skin: Skin is warm and dry. No rash noted.   Cardiovascular: Normal heart rate noted  Respiratory: Normal respiratory effort, no problems with respiration noted  Abdomen: Soft, gravid, appropriate for gestational age. Pain/Pressure: Absent     Pelvic:  Cervical exam deferred        Extremities: Normal range of motion.  Edema: None  Mental Status: Normal mood and affect. Normal behavior. Normal judgment and thought content.   Assessment   25 y.o. [name] by  10/31/2020, by Last Menstrual Period presenting for routine prenatal visit  Plan   pregnancy 1 Problems (from 04/21/20 to present)      Problem Noted Resolved   Rh negative status during pregnancy 08/10/2020 by Conard Novak, MD No   Encounter for supervision of normal first pregnancy in first trimester 04/21/2020 by Natale Milch, MD No   Overview Addendum 07/21/2020 11:36 AM by Natale Milch, MD     Nursing Staff Provider  Office Location  [facility] Dating  [place] 13 weeks  Language  English Anatomy US    Flu Vaccine  2021 Genetic Screen  NIPS: nml XY  TDaP vaccine    Hgb A1C or  GTT Third trimester :   Covid Vaccinated Had covid after vaccination jan 2022   LAB RESULTS   Rhogam   Blood Type A/Negative/-- (03/08 1101)   Feeding Plan breast Antibody Negative (03/08 1101)  Contraception pills Rubella <0.90 (03/08 1101)  Circumcision  RPR Non Reactive (03/08 1101)   Pediatrician   HBsAg Negative (03/08 1101)   Support Person Pennie Rushing- FOB HIV Non Reactive (03/08 1101)  Prenatal Classes Discussed Varicella NON IMMUNE    GBS  (For PCN allergy, check sensitivities)   BTL Consent no    VBAC Consent n/a Pap  LGSIL 02/2020, ASCUS 2021 - repeat pap  In 2023 per [facility]    Colpo                 Preterm labor symptoms and general obstetric precautions including but not limited to vaginal bleeding, contractions, leaking of fluid and fetal movement were reviewed in detail with the patient. Please refer to After Visit Summary for other counseling recommendations.   - labor precautions  Return in about 2 weeks (around 09/07/2020) for Routine Prenatal Appointment.   Thomasene Mohair, MD, [facility], [facility] 08/24/2020 12:16 PM

## 2020-08-26 NOTE — Telephone Encounter (Signed)
Danielle from [facility] calling regarding missing dx codes for compression stockings and preg support band; they have not recv'd the faxes they sent to [place].  Can call to give verbal inforamtion at [phone] or fax [phone]

## 2020-09-07 ENCOUNTER — Other Ambulatory Visit: Payer: Self-pay

## 2020-09-07 ENCOUNTER — Ambulatory Visit (INDEPENDENT_AMBULATORY_CARE_PROVIDER_SITE_OTHER): Payer: Medicaid Other | Admitting: Obstetrics & Gynecology

## 2020-09-07 ENCOUNTER — Encounter: Payer: Self-pay | Admitting: Obstetrics & Gynecology

## 2020-09-07 VITALS — BP 100/70 | Wt 196.0 lb

## 2020-09-07 DIAGNOSIS — Z3A32 32 weeks gestation of pregnancy: Secondary | ICD-10-CM

## 2020-09-07 DIAGNOSIS — O1203 Gestational edema, third trimester: Secondary | ICD-10-CM

## 2020-09-07 DIAGNOSIS — M545 Low back pain, unspecified: Secondary | ICD-10-CM

## 2020-09-07 DIAGNOSIS — Z3403 Encounter for supervision of normal first pregnancy, third trimester: Secondary | ICD-10-CM

## 2020-09-07 DIAGNOSIS — Z23 Encounter for immunization: Secondary | ICD-10-CM

## 2020-09-07 DIAGNOSIS — Z3401 Encounter for supervision of normal first pregnancy, first trimester: Secondary | ICD-10-CM

## 2020-09-07 DIAGNOSIS — O26893 Other specified pregnancy related conditions, third trimester: Secondary | ICD-10-CM

## 2020-09-07 NOTE — Patient Instructions (Signed)
Thank you for choosing [facility]. As part of our ongoing efforts to improve patient experience, we would appreciate your feedback. Please fill out the short survey that you will receive by mail or MyChart. Your opinion is important to us! °-Dr Regie Bunner ° °Braxton Hicks Contractions °Contractions of the uterus can occur throughout pregnancy, but they are not always a sign that you are in labor. You may have practice contractions called Braxton Hicks contractions. These false labor contractions are sometimes confused with true labor. °What are Braxton Hicks contractions? °Braxton Hicks contractions are tightening movements that occur in the muscles of the uterus before labor. Unlike true labor contractions, these contractions do not result in opening (dilation) and thinning of the lowest part of the uterus (cervix). Toward the end of pregnancy (32-34 weeks), Braxton Hicks contractions can happen more often and may become stronger. These contractions are sometimes difficult to tell apart from true labor because they can be very uncomfortable. °How to tell the difference between true labor and false labor °True labor °Contractions last 30-70 seconds. °Contractions become very regular. °Discomfort is usually felt in the top of the uterus, and it spreads to the lower abdomen and low back. °Contractions do not go away with walking. °Contractions usually become stronger and more frequent. °The cervix dilates and gets thinner. °False labor °Contractions are usually shorter, weaker, and farther apart than true labor contractions. °Contractions are usually irregular. °Contractions are often felt in the front of the lower abdomen and in the groin. °Contractions may go away when you walk around or change positions while lying down. °The cervix usually does not dilate or become thin. °Sometimes, the only way to tell if you are in true labor is for your health care provider to look for changes in your cervix. °Your health care  provider will do a physical exam and may monitor your contractions. If you are in true labor, your health care provider will send you home with instructions about when to return to the hospital. °You may continue to have Braxton Hicks contractions until you go into true labor. °Follow these instructions at home: ° °Take over-the-counter and prescription medicines only as told by your health care provider. °If Braxton Hicks contractions are making you uncomfortable: °Change your position from lying down or resting to walking, or change from walking to resting. °Sit and rest in a tub of warm water. °Drink enough fluid to keep your urine pale yellow. Dehydration may cause these contractions. °Do slow and deep breathing several times an hour. °Keep all follow-up visits. This is important. °Contact a health care provider if: °You have a fever. °You have continuous pain in your abdomen. °Your contractions become stronger, more regular, and closer together. °You pass blood-tinged mucus. °Get help right away if: °You have fluid leaking or gushing from your vagina. °You have bright red blood coming from your vagina. °Your baby is not moving inside you as much as it used to. °Summary °You may have practice contractions called Braxton Hicks contractions. These false labor contractions are sometimes confused with true labor. °Braxton Hicks contractions are usually shorter, weaker, farther apart, and less regular than true labor contractions. True labor contractions usually become stronger, more regular, and more frequent. °Manage discomfort from Braxton Hicks contractions by changing position, resting in a warm bath, practicing deep breathing, and drinking plenty of water. °Keep all follow-up visits. Contact your health care provider if your contractions become stronger, more regular, and closer together. °This information is not intended to   replace advice given to you by your health care provider. Make sure you discuss any  questions you have with your health care provider. °Document Revised: 12/09/2019 Document Reviewed: 12/09/2019 °Elsevier Patient Education © 2022 Elsevier Inc. ° °

## 2020-09-07 NOTE — Addendum Note (Signed)
Addended by: Cornelius Moras D on: 09/07/2020 11:34 AM   Modules accepted: Orders

## 2020-09-07 NOTE — Progress Notes (Signed)
  Subjective  Fetal Movement? yes Contractions? no Leaking Fluid? no Vaginal Bleeding? no  Objective  BP 100/70   Wt 196 lb (88.9 kg)   LMP 01/25/2020   BMI 33.64 kg/m  General: NAD Pumonary: no increased work of breathing Abdomen: gravid, non-tender Extremities: no edema Psychiatric: mood appropriate, affect full  Assessment  25 y.o. [name] by  10/31/2020, by Last Menstrual Period presenting for routine prenatal visit  Plan   Problem List Items Addressed This Visit      Other   Encounter for supervision of normal first pregnancy in first trimester  Other Visit Diagnoses    Encounter for supervision of normal first pregnancy in third trimester    -  Primary   [redacted] weeks gestation of pregnancy       Edema during pregnancy in third trimester       Low back pain during pregnancy, third trimester        PTL precuations discussed Edema- rec compression hose LBP- rec abdomen support binder PNV, FMC  pregnancy 1 Problems (from 04/21/20 to present)    Problem Noted Resolved   Rh negative status during pregnancy 08/10/2020 by Conard Novak, MD No   Encounter for supervision of normal first pregnancy in first trimester 04/21/2020 by Natale Milch, MD No   Overview Addendum 09/07/2020 11:13 AM by Nadara Mustard, MD     Nursing Staff Provider  Office Location  [facility] Dating  [place] 13 weeks  Language  English Anatomy US    Flu Vaccine  2021 Genetic Screen  NIPS: nml XY  TDaP vaccine   09/07/20 Hgb A1C or  GTT Third trimester :   Covid Vaccinated Had covid after vaccination jan 2022   LAB RESULTS   Rhogam  08/10/20 Blood Type A/Negative/-- (03/08 1101)   Feeding Plan breast Antibody Negative (03/08 1101)  Contraception pills Rubella <0.90 (03/08 1101)  Circumcision  RPR Non Reactive (03/08 1101)   Pediatrician   HBsAg Negative (03/08 1101)   Support Person Pennie Rushing- FOB HIV Non Reactive (03/08 1101)  Prenatal Classes Discussed Varicella NON IMMUNE    GBS  (For PCN  allergy, check sensitivities)   BTL Consent no    VBAC Consent n/a Pap  LGSIL 02/2020, ASCUS 2021 - repeat pap  In 2023 per [facility]    Colpo               Annamarie Major, MD, [facility], [facility] 09/07/2020  11:26 AM

## 2020-09-07 NOTE — Telephone Encounter (Signed)
Spoke w/Marissa at [facility] to give diagnosis codes for compression garments per Dr. Tiburcio Pea: Tiburcio Pea, Harrel Lemon, MD  Rocco Serene, LPN The dx codes for Belly Band is [phone], M54.50  And for Compression Hose is O12.03

## 2020-09-25 ENCOUNTER — Ambulatory Visit (INDEPENDENT_AMBULATORY_CARE_PROVIDER_SITE_OTHER): Payer: Medicaid Other | Admitting: Obstetrics & Gynecology

## 2020-09-25 ENCOUNTER — Other Ambulatory Visit: Payer: Self-pay

## 2020-09-25 ENCOUNTER — Encounter: Payer: Self-pay | Admitting: Obstetrics & Gynecology

## 2020-09-25 VITALS — BP 102/70 | Wt 201.0 lb

## 2020-09-25 DIAGNOSIS — Z3A34 34 weeks gestation of pregnancy: Secondary | ICD-10-CM

## 2020-09-25 DIAGNOSIS — Z3403 Encounter for supervision of normal first pregnancy, third trimester: Secondary | ICD-10-CM

## 2020-09-25 NOTE — Progress Notes (Signed)
  Subjective  Fetal Movement? yes Contractions? no Leaking Fluid? no Vaginal Bleeding? no  Objective  BP 102/70   Wt 201 lb (91.2 kg)   LMP 01/25/2020   BMI 34.50 kg/m  General: NAD Pumonary: no increased work of breathing Abdomen: gravid, non-tender Extremities: no edema Psychiatric: mood appropriate, affect full  Assessment  25 y.o. [name] by  10/31/2020, by Last Menstrual Period presenting for routine prenatal visit  Plan   Problem List Items Addressed This Visit   None Visit Diagnoses    Encounter for supervision of normal first pregnancy in third trimester    -  Primary   [redacted] weeks gestation of pregnancy        PNV, FMC, PTL precautions GBS nv  pregnancy 1 Problems (from 04/21/20 to present)    Problem Noted Resolved   Rh negative status during pregnancy 08/10/2020 by Conard Novak, MD No   Encounter for supervision of normal first pregnancy in first trimester 04/21/2020 by Natale Milch, MD No   Overview Addendum 09/07/2020 11:13 AM by Nadara Mustard, MD     Nursing Staff Provider  Office Location  [facility] Dating  [place] 13 weeks  Language  English Anatomy US    Flu Vaccine  2021 Genetic Screen  NIPS: nml XY  TDaP vaccine   09/07/20 Hgb A1C or  GTT Third trimester :   Covid Vaccinated Had covid after vaccination jan 2022   LAB RESULTS   Rhogam  08/10/20 Blood Type A/Negative/-- (03/08 1101)   Feeding Plan breast Antibody Negative (03/08 1101)  Contraception pills Rubella <0.90 (03/08 1101)  Circumcision  RPR Non Reactive (03/08 1101)   Pediatrician   HBsAg Negative (03/08 1101)   Support Person Pennie Rushing- FOB HIV Non Reactive (03/08 1101)  Prenatal Classes Discussed Varicella NON IMMUNE    GBS  (For PCN allergy, check sensitivities)   BTL Consent no    VBAC Consent n/a Pap  LGSIL 02/2020, ASCUS 2021 - repeat pap  In 2023 per [facility]    Colpo               Annamarie Major, MD, [facility], [facility] 09/25/2020  10:54 AM

## 2020-09-25 NOTE — Patient Instructions (Signed)
Thank you for choosing [facility]. As part of our ongoing efforts to improve patient experience, we would appreciate your feedback. Please fill out the short survey that you will receive by mail or MyChart. Your opinion is important to us! °-Dr Syble Picco ° °Group B Streptococcus Infection During Pregnancy °Group B Streptococcus (GBS) is a type of bacteria that is often found in healthy people. It is commonly found in the rectum, vagina, and intestines. In people who are healthy and not pregnant, the bacteria rarely cause serious illness or complications. However, women who test positive for GBS during pregnancy can pass the bacteria to the baby during childbirth. This can cause serious infection in the baby after birth. °Women with GBS may also have infections during their pregnancy or soon after childbirth. The infections include urinary tract infections (UTIs) or infections of the uterus. GBS also increases a woman's risk of complications during pregnancy, such as early labor or delivery, miscarriage, or stillbirth. Routine testing for GBS is recommended for all pregnant women. °What are the causes? °This condition is caused by bacteria called Streptococcus agalactiae. °What increases the risk? °You may have a higher risk for GBS infection during pregnancy if you had one during a past pregnancy. °What are the signs or symptoms? °In most cases, GBS infection does not cause symptoms in pregnant women. If symptoms exist, they may include: °Labor that starts before the 37th week of pregnancy. °A UTI or bladder infection. This may cause a fever, frequent urination, or pain and burning during urination. °Fever during labor. There can also be a rapid heartbeat in the mother or baby. °Rare but serious symptoms of a GBS infection in women include: °Blood infection (septicemia). This may cause fever, chills, or confusion. °Lung infection (pneumonia). This may cause fever, chills, cough, rapid breathing, chest pain, or  difficulty breathing. °Bone, joint, skin, or soft tissue infection. °How is this diagnosed? °You may be screened for GBS between week 35 and week 37 of pregnancy. If you have symptoms of preterm labor, you may be screened earlier. This condition is diagnosed based on lab test results from: °A swab of fluid from the vagina and rectum. °A urine sample. °How is this treated? °This condition is treated with antibiotic medicine. Antibiotic medicine may be given: °To you when you go into labor, or as soon as your water breaks. The medicines will continue until after you give birth. If you are having a cesarean delivery, you do not need antibiotics unless your water has broken. °To your baby, if he or she requires treatment. Your health care provider will check your baby to decide if he or she needs antibiotics to prevent a serious infection. °Follow these instructions at home: °Take over-the-counter and prescription medicines only as told by your health care provider. °Take your antibiotic medicine as told by your health care provider. Do not stop taking the antibiotic even if you start to feel better. °Keep all pre-birth (prenatal) visits and follow-up visits as told by your health care provider. This is important. °Contact a health care provider if: °You have pain or burning when you urinate. °You have to urinate more often than usual. °You have a fever or chills. °You develop a bad-smelling vaginal discharge. °Get help right away if: °Your water breaks. °You go into labor. °You have severe pain in your abdomen. °You have difficulty breathing. °You have chest pain. °These symptoms may represent a serious problem that is an emergency. Do not wait to see if the symptoms   Get medical help right away. Call your local emergency services (911 in the [place] Do not drive yourself to the hospital. Summary GBS is a type of bacteria that is common in healthy people. During pregnancy, colonization with GBS can cause  serious complications for you or your baby. Your health care provider will screen you between 35 and 37 weeks of pregnancy to determine if you are colonized with GBS. If you are colonized with GBS during pregnancy, your health care provider will recommend antibiotics through an IV during labor. After delivery, your baby will be evaluated for complications related to potential GBS infection and may require antibiotics to prevent a serious infection. This information is not intended to replace advice given to you by your health care provider. Make sure you discuss any questions you have with your healthcare provider. Document Revised: 12/03/2019 Document Reviewed: 08/27/2018 Elsevier Patient Education  2022 [employer].

## 2020-10-09 ENCOUNTER — Ambulatory Visit (INDEPENDENT_AMBULATORY_CARE_PROVIDER_SITE_OTHER): Payer: Medicaid Other | Admitting: Obstetrics and Gynecology

## 2020-10-09 ENCOUNTER — Other Ambulatory Visit (HOSPITAL_COMMUNITY)
Admission: RE | Admit: 2020-10-09 | Discharge: 2020-10-09 | Disposition: A | Discharge status: Home / Self Care | Payer: Medicaid Other | Source: Ambulatory Visit | Attending: Obstetrics and Gynecology | Admitting: Obstetrics and Gynecology

## 2020-10-09 ENCOUNTER — Other Ambulatory Visit: Payer: Self-pay

## 2020-10-09 ENCOUNTER — Encounter: Payer: Self-pay | Admitting: Obstetrics and Gynecology

## 2020-10-09 VITALS — BP 118/74 | Wt 202.0 lb

## 2020-10-09 DIAGNOSIS — Z3A36 36 weeks gestation of pregnancy: Secondary | ICD-10-CM

## 2020-10-09 DIAGNOSIS — Z6791 Unspecified blood type, Rh negative: Secondary | ICD-10-CM

## 2020-10-09 DIAGNOSIS — Z3403 Encounter for supervision of normal first pregnancy, third trimester: Secondary | ICD-10-CM

## 2020-10-09 DIAGNOSIS — O26893 Other specified pregnancy related conditions, third trimester: Secondary | ICD-10-CM

## 2020-10-09 NOTE — Progress Notes (Signed)
Routine Prenatal Care Visit  Subjective  Vanessa Rodgers is a 25 y.o. [name] being seen today for ongoing prenatal care.  She is currently monitored for the following issues for this low-risk pregnancy and has Encounter for supervision of normal first pregnancy in first trimester and Rh negative status during pregnancy on their problem list.  ----------------------------------------------------------------------------------- Patient reports no complaints.   Contractions: Not present. Vag. Bleeding: None.  Movement: Present. Leaking Fluid denies.  ----------------------------------------------------------------------------------- The following portions of the patient's history were reviewed and updated as appropriate: allergies, current medications, past family history, past medical history, past social history, past surgical history and problem list. Problem list updated.  Objective  Blood pressure 118/74, weight 202 lb (91.6 kg), last menstrual period 01/25/2020. Pregravid weight 148 lb (67.1 kg) Total Weight Gain 54 lb (24.5 kg) Urinalysis: Urine Protein    Urine Glucose    Fetal Status: Fetal Heart Rate (bpm): 140 Fundal Height: 37 cm Movement: Present  Presentation: Vertex by u/s  General:  Alert, oriented and cooperative. Patient is in no acute distress.  Skin: Skin is warm and dry. No rash noted.   Cardiovascular: Normal heart rate noted  Respiratory: Normal respiratory effort, no problems with respiration noted  Abdomen: Soft, gravid, appropriate for gestational age. Pain/Pressure: Present     Pelvic:  Cervical exam deferred        Extremities: Normal range of motion.  Edema: None  Mental Status: Normal mood and affect. Normal behavior. Normal judgment and thought content.   Female chaperone present for pelvic exam  Assessment   25 y.o. [name] by  10/31/2020, by Last Menstrual Period presenting for routine prenatal visit  Plan   pregnancy 1 Problems (from  04/21/20 to present)     Problem Noted Resolved   Rh negative status during pregnancy 08/10/2020 by Conard Novak, MD No   Encounter for supervision of normal first pregnancy in first trimester 04/21/2020 by Natale Milch, MD No   Overview Addendum 09/07/2020 11:13 AM by Nadara Mustard, MD     Nursing Staff Provider  Office Location  [facility] Dating  [place] 13 weeks  Language  English Anatomy US    Flu Vaccine  2021 Genetic Screen  NIPS: nml XY  TDaP vaccine   09/07/20 Hgb A1C or  GTT Third trimester :   Covid Vaccinated Had covid after vaccination jan 2022   LAB RESULTS   Rhogam  08/10/20 Blood Type A/Negative/-- (03/08 1101)   Feeding Plan breast Antibody Negative (03/08 1101)  Contraception pills Rubella <0.90 (03/08 1101)  Circumcision  RPR Non Reactive (03/08 1101)   Pediatrician   HBsAg Negative (03/08 1101)   Support Person Pennie Rushing- FOB HIV Non Reactive (03/08 1101)  Prenatal Classes Discussed Varicella NON IMMUNE    GBS  (For PCN allergy, check sensitivities)   BTL Consent no    VBAC Consent n/a Pap  LGSIL 02/2020, ASCUS 2021 - repeat pap  In 2023 per [facility]    Colpo                Preterm labor symptoms and general obstetric precautions including but not limited to vaginal bleeding, contractions, leaking of fluid and fetal movement were reviewed in detail with the patient. Please refer to After Visit Summary for other counseling recommendations.   GBS/aptima today  Return in about 3 weeks (around 10/30/2020) for ROB (keep previously scheduled appts).   Thomasene Mohair, MD, [facility], [facility] 10/09/2020  4:33 PM

## 2020-10-11 LAB — STREP GP B NAA: Strep Gp B NAA: NEGATIVE

## 2020-10-13 ENCOUNTER — Encounter: Payer: Self-pay | Admitting: Obstetrics and Gynecology

## 2020-10-13 ENCOUNTER — Other Ambulatory Visit: Payer: Self-pay

## 2020-10-13 ENCOUNTER — Observation Stay
Admission: EM | Admit: 2020-10-13 | Discharge: 2020-10-13 | Disposition: A | Discharge status: Home / Self Care | Payer: Medicaid Other | Attending: Obstetrics and Gynecology | Admitting: Obstetrics and Gynecology

## 2020-10-13 DIAGNOSIS — O471 False labor at or after 37 completed weeks of gestation: Secondary | ICD-10-CM | POA: Insufficient documentation

## 2020-10-13 DIAGNOSIS — R030 Elevated blood-pressure reading, without diagnosis of hypertension: Secondary | ICD-10-CM

## 2020-10-13 DIAGNOSIS — Z3A37 37 weeks gestation of pregnancy: Secondary | ICD-10-CM | POA: Insufficient documentation

## 2020-10-13 DIAGNOSIS — Z3401 Encounter for supervision of normal first pregnancy, first trimester: Secondary | ICD-10-CM

## 2020-10-13 DIAGNOSIS — O26893 Other specified pregnancy related conditions, third trimester: Secondary | ICD-10-CM | POA: Diagnosis present

## 2020-10-13 DIAGNOSIS — O99891 Other specified diseases and conditions complicating pregnancy: Secondary | ICD-10-CM | POA: Diagnosis not present

## 2020-10-13 DIAGNOSIS — Z6791 Unspecified blood type, Rh negative: Secondary | ICD-10-CM

## 2020-10-13 DIAGNOSIS — R2240 Localized swelling, mass and lump, unspecified lower limb: Secondary | ICD-10-CM | POA: Diagnosis not present

## 2020-10-13 LAB — COMPREHENSIVE METABOLIC PANEL
ALT: 11 U/L (ref 0–44)
AST: 20 U/L (ref 15–41)
Albumin: 2.8 g/dL — ABNORMAL LOW (ref 3.5–5.0)
Alkaline Phosphatase: 81 U/L (ref 38–126)
Anion gap: 9 (ref 5–15)
BUN: 12 mg/dL (ref 6–20)
CO2: 20 mmol/L — ABNORMAL LOW (ref 22–32)
Calcium: 8.5 mg/dL — ABNORMAL LOW (ref 8.9–10.3)
Chloride: 106 mmol/L (ref 98–111)
Creatinine, Ser: 0.77 mg/dL (ref 0.44–1.00)
GFR, Estimated: >60 mL/min (ref >60)
Glucose, Bld: 121 mg/dL — ABNORMAL HIGH (ref 70–99)
Potassium: 3.7 mmol/L (ref 3.5–5.1)
Sodium: 135 mmol/L (ref 135–145)
Total Bilirubin: 0.4 mg/dL (ref 0.3–1.2)
Total Protein: 5.8 g/dL — ABNORMAL LOW (ref 6.5–8.1)

## 2020-10-13 LAB — CERVICOVAGINAL ANCILLARY ONLY
Chlamydia: NEGATIVE
Comment: NEGATIVE
Comment: NORMAL
Neisseria Gonorrhea: NEGATIVE

## 2020-10-13 LAB — CBC
HCT: 33 % — ABNORMAL LOW (ref 36.0–46.0)
Hemoglobin: 10.9 g/dL — ABNORMAL LOW (ref 12.0–15.0)
MCH: 30.7 pg (ref 26.0–34.0)
MCHC: 33 g/dL (ref 30.0–36.0)
MCV: 93 fL (ref 80.0–100.0)
Platelets: 235 10*3/uL (ref 150–400)
RBC: 3.55 MIL/uL — ABNORMAL LOW (ref 3.87–5.11)
RDW: 14.3 % (ref 11.5–15.5)
WBC: 8 10*3/uL (ref 4.0–10.5)
nRBC: 0 % (ref 0.0–0.2)

## 2020-10-13 LAB — PROTEIN / CREATININE RATIO, URINE
Creatinine, Urine: 224 mg/dL
Protein Creatinine Ratio: 0.1 mg/mg{Cre} (ref 0.00–0.15)
Total Protein, Urine: 22 mg/dL

## 2020-10-13 NOTE — OB Triage Note (Signed)
Pt [name] [facility] w/ c/o elevated blood pressure at home reporting 138/86, swelling in her feet and ankles and intermittent period like cramps that have been going on for a week or so. Reports no vag bleeding, no LOF, +FM, and no Ctx. Initial BP 134/86.

## 2020-10-13 NOTE — Discharge Summary (Signed)
Physician Final Progress Note  Patient ID: Vanessa Rodgers MRN: [id number] DOB/AGE: 1995/06/27 25 y.o.  Admit date: 10/13/2020 Admitting provider: [name], MD Discharge date: 10/13/2020   Admission Diagnoses: Elevated home BP reading  Discharge Diagnoses:  Active Problems:   Labor and delivery indication for care or intervention  25 y.o. [name] by Estimated Date of Delivery: 10/31/20 presenting with elevated home BP reading 139/84.  Patient has noted increased lower extremity edema.  Denies headaches, vision changes, RUQ or epigastric pain.  Irregular contractions, no LOF, no VB, +FM. Pregnancy has been uncomplicated to date, she has been normotensive at clinic visits, no history of HTN.    Elevation on L&D showed normal BP.  Laboratory evaluation including CBC, CMP, P/C ratio did not show any laboratory values consistent with pre-eclampsia.  She has clinic follow up in place on 10/15/20  Blood pressure 118/78, pulse 99, temperature 99 F (37.2 C), temperature source Oral, resp. rate 18, last menstrual period 01/25/2020.  pregnancy 1 Problems (from 04/21/20 to present)     Problem Noted Resolved   Rh negative status during pregnancy 08/10/2020 by Conard Novak, MD No   Encounter for supervision of normal first pregnancy in first trimester 04/21/2020 by Natale Milch, MD No   Overview Addendum 09/07/2020 11:13 AM by Nadara Mustard, MD     Nursing Staff Provider  Office Location  [facility] Dating  [place] 13 weeks  Language  English Anatomy US    Flu Vaccine  2021 Genetic Screen  NIPS: nml XY  TDaP vaccine   09/07/20 Hgb A1C or  GTT Third trimester :   Covid Vaccinated Had covid after vaccination jan 2022   LAB RESULTS   Rhogam  08/10/20 Blood Type A/Negative/-- (03/08 1101)   Feeding Plan breast Antibody Negative (03/08 1101)  Contraception pills Rubella <0.90 (03/08 1101)  Circumcision  RPR Non Reactive (03/08 1101)   Pediatrician   HBsAg Negative (03/08 1101)    Support Person Pennie Rushing- FOB HIV Non Reactive (03/08 1101)  Prenatal Classes Discussed Varicella NON IMMUNE    GBS  (For PCN allergy, check sensitivities)   BTL Consent no    VBAC Consent n/a Pap  LGSIL 02/2020, ASCUS 2021 - repeat pap  In 2023 per [facility]    Colpo                  Consults: None  Significant Findings/ Diagnostic Studies:  Results for orders placed or performed during the hospital encounter of 10/13/20 (from the past 24 hour(s))  Protein / creatinine ratio, urine     Status: None   Collection Time: 10/13/20  9:54 PM  Result Value Ref Range   Creatinine, Urine 224 mg/dL   Total Protein, Urine 22 mg/dL   Protein Creatinine Ratio 0.10 0.00 - 0.15 mg/mg[Cre]  CBC     Status: Abnormal   Collection Time: 10/13/20 10:16 PM  Result Value Ref Range   WBC 8.0 4.0 - 10.5 K/uL   RBC 3.55 (L) 3.87 - 5.11 MIL/uL   Hemoglobin 10.9 (L) 12.0 - 15.0 g/dL   HCT 66.4 (L) 40.3 - 47.4 %   MCV 93.0 80.0 - 100.0 fL   MCH 30.7 26.0 - 34.0 pg   MCHC 33.0 30.0 - 36.0 g/dL   RDW 25.9 56.3 - 87.5 %   Platelets 235 150 - 400 K/uL   nRBC 0.0 0.0 - 0.2 %  Comprehensive metabolic panel     Status: Abnormal   Collection  Time: 10/13/20 10:16 PM  Result Value Ref Range   Sodium 135 135 - 145 mmol/L   Potassium 3.7 3.5 - 5.1 mmol/L   Chloride 106 98 - 111 mmol/L   CO2 20 (L) 22 - 32 mmol/L   Glucose, Bld 121 (H) 70 - 99 mg/dL   BUN 12 6 - 20 mg/dL   Creatinine, Ser 3.82 0.44 - 1.00 mg/dL   Calcium 8.5 (L) 8.9 - 10.3 mg/dL   Total Protein 5.8 (L) 6.5 - 8.1 g/dL   Albumin 2.8 (L) 3.5 - 5.0 g/dL   AST 20 15 - 41 U/L   ALT 11 0 - 44 U/L   Alkaline Phosphatase 81 38 - 126 U/L   Total Bilirubin 0.4 0.3 - 1.2 mg/dL   GFR, Estimated >50 >53 mL/min   Anion gap 9 5 - 15     Procedures:  Baseline: 140 Variability: moderate Accelerations: present Decelerations: absent Tocometry: irregular, every 8 minutes The patient was monitored for >30 minutes, fetal heart rate tracing was deemed  reactive, category I tracing,  Discharge Condition: good  Disposition: Discharge disposition: 01-Home or Self Care       Diet: Regular diet  Discharge Activity: Activity as tolerated  Discharge Instructions     Discharge activity:  No Restrictions   Complete by: As directed    Discharge diet:  No restrictions   Complete by: As directed    Fetal Kick Count:  Lie on our left side for one hour after a meal, and count the number of times your baby kicks.  If it is less than 5 times, get up, move around and drink some juice.  Repeat the test 30 minutes later.  If it is still less than 5 kicks in an hour, notify your doctor.   Complete by: As directed    LABOR:  When conractions begin, you should start to time them from the beginning of one contraction to the beginning  of the next.  When contractions are 5 - 10 minutes apart or less and have been regular for at least an hour, you should call your health care provider.   Complete by: As directed    No sexual activity restrictions   Complete by: As directed    Notify physician for bleeding from the vagina   Complete by: As directed    Notify physician for blurring of vision or spots before the eyes   Complete by: As directed    Notify physician for chills or fever   Complete by: As directed    Notify physician for fainting spells, "black outs" or loss of consciousness   Complete by: As directed    Notify physician for increase in vaginal discharge   Complete by: As directed    Notify physician for leaking of fluid   Complete by: As directed    Notify physician for pain or burning when urinating   Complete by: As directed    Notify physician for pelvic pressure (sudden increase)   Complete by: As directed    Notify physician for severe or continued nausea or vomiting   Complete by: As directed    Notify physician for sudden gushing of fluid from the vagina (with or without continued leaking)   Complete by: As directed    Notify  physician for sudden, constant, or occasional abdominal pain   Complete by: As directed    Notify physician if baby moving less than usual   Complete by: As directed  Allergies as of 10/13/2020   No Known Allergies      Medication List     TAKE these medications    prenatal multivitamin Tabs tablet Take 1 tablet by mouth daily at 12 noon.         Total time spent taking care of this patient: 30 minutes  Signed: [name] 10/13/2020, 11:11 PM

## 2020-10-15 ENCOUNTER — Other Ambulatory Visit: Payer: Self-pay

## 2020-10-15 ENCOUNTER — Ambulatory Visit (INDEPENDENT_AMBULATORY_CARE_PROVIDER_SITE_OTHER): Payer: Medicaid Other | Admitting: Obstetrics

## 2020-10-15 VITALS — BP 122/88 | Wt 210.0 lb

## 2020-10-15 DIAGNOSIS — Z3A37 37 weeks gestation of pregnancy: Secondary | ICD-10-CM

## 2020-10-15 DIAGNOSIS — Z3401 Encounter for supervision of normal first pregnancy, first trimester: Secondary | ICD-10-CM

## 2020-10-15 DIAGNOSIS — Z3403 Encounter for supervision of normal first pregnancy, third trimester: Secondary | ICD-10-CM

## 2020-10-15 LAB — POCT URINALYSIS DIPSTICK OB
Glucose, UA: NEGATIVE
POC,PROTEIN,UA: NEGATIVE

## 2020-10-15 NOTE — Addendum Note (Signed)
Addended by: Cornelius Moras D on: 10/15/2020 10:42 AM   Modules accepted: Orders

## 2020-10-15 NOTE — Progress Notes (Signed)
  Routine Prenatal Care Visit  Subjective  Vanessa Rodgers is a 25 y.o. [name] being seen today for ongoing prenatal care.  She is currently monitored for the following issues for this low-risk pregnancy and has Encounter for supervision of normal first pregnancy in first trimester; Rh negative status during pregnancy; and Labor and delivery indication for care or intervention on their problem list.  ----------------------------------------------------------------------------------- Patient reports no complaints.   Contractions: Not present. Vag. Bleeding: None.  Movement: Present. Leaking Fluid denies.  ----------------------------------------------------------------------------------- The following portions of the patient's history were reviewed and updated as appropriate: allergies, current medications, past family history, past medical history, past social history, past surgical history and problem list. Problem list updated.  Objective  Blood pressure 122/88, weight 210 lb (95.3 kg), last menstrual period 01/25/2020. Pregravid weight 148 lb (67.1 kg) Total Weight Gain 62 lb (28.1 kg) Urinalysis: Urine Protein    Urine Glucose    Fetal Status:     Movement: Present     General:  Alert, oriented and cooperative. Patient is in no acute distress.  Skin: Skin is warm and dry. No rash noted.   Cardiovascular: Normal heart rate noted  Respiratory: Normal respiratory effort, no problems with respiration noted  Abdomen: Soft, gravid, appropriate for gestational age. Pain/Pressure: Absent     Pelvic:  Cervical exam performed      1.5cms/40%/ballotable. roomy pelvis, cervix is posterior  Extremities: Normal range of motion.     Mental Status: Normal mood and affect. Normal behavior. Normal judgment and thought content.   Assessment   25 y.o. [name] by  10/31/2020, by Last Menstrual Period presenting for routine prenatal visit  Plan   pregnancy 1 Problems (from 04/21/20 to  present)    Problem Noted Resolved   Rh negative status during pregnancy 08/10/2020 by Conard Novak, MD No   Encounter for supervision of normal first pregnancy in first trimester 04/21/2020 by Natale Milch, MD No   Overview Addendum 10/15/2020 10:14 AM by Mirna Mires, CNM     Nursing Staff Provider  Office Location  [facility] Dating  [place] 13 weeks  Language  English Anatomy US    Flu Vaccine  2021 Genetic Screen  NIPS: nml XY  TDaP vaccine   09/07/20 Hgb A1C or  GTT Third trimester :   Covid Vaccinated Had covid after vaccination jan 2022   LAB RESULTS   Rhogam  08/10/20 Blood Type A/Negative/-- (03/08 1101)   Feeding Plan breast Antibody Negative (06/27 1143)  Contraception pills Rubella <0.90 (03/08 1101)  Circumcision  RPR Non Reactive (06/27 1143)   Pediatrician   HBsAg Negative (03/08 1101)   Support Person Pennie Rushing- FOB HIV Non Reactive (06/27 1143)  Prenatal Classes Discussed Varicella NON IMMUNE    GBS Negative/-- (08/26 1636)(For PCN allergy, check sensitivities)   BTL Consent no    VBAC Consent n/a Pap  LGSIL 02/2020, ASCUS 2021 - repeat pap  In 2023 per [facility]    Colpo               Term labor symptoms and general obstetric precautions including but not limited to vaginal bleeding, contractions, leaking of fluid and fetal movement were reviewed in detail with the patient. Please refer to After Visit Summary for other counseling recommendations.   Return in about 1 week (around 10/22/2020) for return OB.  Mirna Mires, CNM  10/15/2020 10:37 AM

## 2020-10-16 ENCOUNTER — Other Ambulatory Visit: Payer: Self-pay

## 2020-10-16 ENCOUNTER — Encounter: Payer: Self-pay | Admitting: Obstetrics and Gynecology

## 2020-10-16 ENCOUNTER — Inpatient Hospital Stay
Admission: EM | Admit: 2020-10-16 | Discharge: 2020-10-18 | DRG: 807 | Disposition: A | Discharge status: Home / Self Care | Payer: Medicaid Other | Attending: Obstetrics and Gynecology | Admitting: Obstetrics and Gynecology

## 2020-10-16 DIAGNOSIS — Z3A38 38 weeks gestation of pregnancy: Secondary | ICD-10-CM | POA: Diagnosis not present

## 2020-10-16 DIAGNOSIS — Z3403 Encounter for supervision of normal first pregnancy, third trimester: Secondary | ICD-10-CM

## 2020-10-16 DIAGNOSIS — Z6791 Unspecified blood type, Rh negative: Secondary | ICD-10-CM

## 2020-10-16 DIAGNOSIS — Z20822 Contact with and (suspected) exposure to covid-19: Secondary | ICD-10-CM | POA: Diagnosis present

## 2020-10-16 DIAGNOSIS — Z3A37 37 weeks gestation of pregnancy: Secondary | ICD-10-CM

## 2020-10-16 DIAGNOSIS — O9081 Anemia of the puerperium: Secondary | ICD-10-CM | POA: Diagnosis not present

## 2020-10-16 DIAGNOSIS — O26893 Other specified pregnancy related conditions, third trimester: Secondary | ICD-10-CM | POA: Diagnosis present

## 2020-10-16 DIAGNOSIS — D62 Acute posthemorrhagic anemia: Secondary | ICD-10-CM | POA: Diagnosis not present

## 2020-10-16 DIAGNOSIS — O26899 Other specified pregnancy related conditions, unspecified trimester: Secondary | ICD-10-CM

## 2020-10-16 LAB — COMPREHENSIVE METABOLIC PANEL
ALT: 12 U/L (ref 0–44)
AST: 18 U/L (ref 15–41)
Albumin: 2.9 g/dL — ABNORMAL LOW (ref 3.5–5.0)
Alkaline Phosphatase: 78 U/L (ref 38–126)
Anion gap: 8 (ref 5–15)
BUN: 12 mg/dL (ref 6–20)
CO2: 21 mmol/L — ABNORMAL LOW (ref 22–32)
Calcium: 8.5 mg/dL — ABNORMAL LOW (ref 8.9–10.3)
Chloride: 105 mmol/L (ref 98–111)
Creatinine, Ser: 0.62 mg/dL (ref 0.44–1.00)
GFR, Estimated: >60 mL/min (ref >60)
Glucose, Bld: 75 mg/dL (ref 70–99)
Potassium: 4 mmol/L (ref 3.5–5.1)
Sodium: 134 mmol/L — ABNORMAL LOW (ref 135–145)
Total Bilirubin: 0.6 mg/dL (ref 0.3–1.2)
Total Protein: 6.1 g/dL — ABNORMAL LOW (ref 6.5–8.1)

## 2020-10-16 LAB — CBC
HCT: 34.3 % — ABNORMAL LOW (ref 36.0–46.0)
Hemoglobin: 11.5 g/dL — ABNORMAL LOW (ref 12.0–15.0)
MCH: 30.7 pg (ref 26.0–34.0)
MCHC: 33.5 g/dL (ref 30.0–36.0)
MCV: 91.7 fL (ref 80.0–100.0)
Platelets: 230 10*3/uL (ref 150–400)
RBC: 3.74 MIL/uL — ABNORMAL LOW (ref 3.87–5.11)
RDW: 14.7 % (ref 11.5–15.5)
WBC: 9.2 10*3/uL (ref 4.0–10.5)
nRBC: 0 % (ref 0.0–0.2)

## 2020-10-16 LAB — RESP PANEL BY RT-PCR (FLU A&B, COVID) ARPGX2
Influenza A by PCR: NEGATIVE
Influenza B by PCR: NEGATIVE
SARS Coronavirus 2 by RT PCR: NEGATIVE

## 2020-10-16 LAB — PROTEIN / CREATININE RATIO, URINE
Creatinine, Urine: 130 mg/dL
Protein Creatinine Ratio: 0.42 mg/mg{Cre} — ABNORMAL HIGH (ref 0.00–0.15)
Total Protein, Urine: 55 mg/dL

## 2020-10-16 LAB — ABO/RH: ABO/RH(D): A NEG

## 2020-10-16 MED ORDER — OXYTOCIN 10 UNIT/ML IJ SOLN
INTRAMUSCULAR | Status: AC
  Filled 2020-10-16: qty 2

## 2020-10-16 MED ORDER — ACETAMINOPHEN 325 MG PO TABS: 650.0000 mg | ORAL_TABLET | ORAL | Status: DC | PRN

## 2020-10-16 MED ORDER — BUTORPHANOL TARTRATE 1 MG/ML IJ SOLN: 1.0000 mg | INTRAMUSCULAR | Status: DC | PRN

## 2020-10-16 MED ORDER — LACTATED RINGERS IV SOLN
500.0000 mL | INTRAVENOUS | Status: DC | PRN
  Administered 2020-10-17 (×2): 500 mL via INTRAVENOUS

## 2020-10-16 MED ORDER — TERBUTALINE SULFATE 1 MG/ML IJ SOLN
0.2500 mg | Freq: Once | INTRAMUSCULAR | Status: DC | PRN
Start: 2020-10-16 — End: 2020-10-17

## 2020-10-16 MED ORDER — LIDOCAINE HCL (PF) 1 % IJ SOLN
30.0000 mL | INTRAMUSCULAR | Status: DC | PRN
  Filled 2020-10-16: qty 30

## 2020-10-16 MED ORDER — ONDANSETRON HCL 4 MG/2ML IJ SOLN: 4.0000 mg | Freq: Four times a day (QID) | INTRAMUSCULAR | Status: DC | PRN

## 2020-10-16 MED ORDER — OXYTOCIN-SODIUM CHLORIDE 30-0.9 UT/500ML-% IV SOLN
1.0000 m[IU]/min | INTRAVENOUS | Status: DC
Start: 2020-10-16 — End: 2020-10-17
  Administered 2020-10-16: 2 m[IU]/min via INTRAVENOUS

## 2020-10-16 MED ORDER — AMMONIA AROMATIC IN INHA
RESPIRATORY_TRACT | Status: AC
  Filled 2020-10-16: qty 10

## 2020-10-16 MED ORDER — LACTATED RINGERS IV SOLN: INTRAVENOUS | Status: DC

## 2020-10-16 MED ORDER — OXYTOCIN BOLUS FROM INFUSION
333.0000 mL | Freq: Once | INTRAVENOUS | Status: AC
  Administered 2020-10-17: 333 mL via INTRAVENOUS

## 2020-10-16 MED ORDER — SOD CITRATE-CITRIC ACID 500-334 MG/5ML PO SOLN: 30.0000 mL | ORAL | Status: DC | PRN

## 2020-10-16 MED ORDER — MISOPROSTOL 200 MCG PO TABS
ORAL_TABLET | ORAL | Status: AC
  Filled 2020-10-16: qty 4

## 2020-10-16 MED ORDER — OXYTOCIN-SODIUM CHLORIDE 30-0.9 UT/500ML-% IV SOLN
2.5000 [IU]/h | INTRAVENOUS | Status: DC
  Filled 2020-10-16: qty 500

## 2020-10-16 NOTE — Anesthesia Preprocedure Evaluation (Signed)
Anesthesia Evaluation  Patient identified by MRN, date of birth, ID band Patient awake    Reviewed: Allergy & Precautions, H&P , NPO status , Patient's Chart, lab work & pertinent test results, reviewed documented beta blocker date and time   Airway Mallampati: II  TM Distance: >3 FB Neck ROM: full    Dental no notable dental hx. (+) Teeth Intact   Pulmonary neg pulmonary ROS, Current Smoker,    Pulmonary exam normal breath sounds clear to auscultation       Cardiovascular Exercise Tolerance: Good negative cardio ROS   Rhythm:regular Rate:Normal     Neuro/Psych negative neurological ROS  negative psych ROS   GI/Hepatic negative GI ROS, Neg liver ROS,   Endo/Other  negative endocrine ROSdiabetes, Gestational  Renal/GU      Musculoskeletal   Abdominal   Peds  Hematology negative hematology ROS (+)   Anesthesia Other Findings   Reproductive/Obstetrics (+) Pregnancy                             Anesthesia Physical Anesthesia Plan  ASA: 2  Anesthesia Plan: Epidural   Post-op Pain Management:    Induction:   PONV Risk Score and Plan:   Airway Management Planned:   Additional Equipment:   Intra-op Plan:   Post-operative Plan:   Informed Consent: I have reviewed the patients History and Physical, chart, labs and discussed the procedure including the risks, benefits and alternatives for the proposed anesthesia with the patient or authorized representative who has indicated his/her understanding and acceptance.       Plan Discussed with:   Anesthesia Plan Comments:         Anesthesia Quick Evaluation  

## 2020-10-16 NOTE — Progress Notes (Signed)
Subjective:  Feeling slightly stronger contractions  Objective:   Vitals: Blood pressure 137/79, pulse 98, temperature 98.4 F (36.9 C), temperature source Oral, resp. rate 18, height 5\' 4"  (1.626 m), weight 95.3 kg, last menstrual period 01/25/2020, SpO2 99 %. Temp:  [98.4 F (36.9 C)] 98.4 F (36.9 C) (09/02 1753) Pulse Rate:  [98] 98 (09/02 1753) Resp:  [18] 18 (09/02 1753) BP: (137)/(79) 137/79 (09/02 1753) SpO2:  [99 %] 99 % (09/02 1753) Weight:  [95.3 kg] 95.3 kg (09/02 1753)  General: NAD Abdomen: gravid, non-tender Cervical Exam:  Dilation: 3 Effacement (%): 80 Cervical Position: Middle Station: -3 Presentation: Vertex Exam by:: [email or web address], MD  FHT: 130, moderate, +accels, no decels Toco: q3-37min  Results for orders placed or performed during the hospital encounter of 10/16/20 (from the past 24 hour(s))  CBC     Status: Abnormal   Collection Time: 10/16/20  6:27 PM  Result Value Ref Range   WBC 9.2 4.0 - 10.5 K/uL   RBC 3.74 (L) 3.87 - 5.11 MIL/uL   Hemoglobin 11.5 (L) 12.0 - 15.0 g/dL   HCT 12/16/20 (L) 40.9 - 81.1 %   MCV 91.7 80.0 - 100.0 fL   MCH 30.7 26.0 - 34.0 pg   MCHC 33.5 30.0 - 36.0 g/dL   RDW 91.4 78.2 - 95.6 %   Platelets 230 150 - 400 K/uL   nRBC 0.0 0.0 - 0.2 %  Type and screen [facility]     Status: None   Collection Time: 10/16/20  6:27 PM  Result Value Ref Range   ABO/RH(D) A NEG    Antibody Screen POS    Sample Expiration 10/19/2020,2359    Antibody Identification PASSIVELY ACQUIRED ANTI-D   Comprehensive metabolic panel     Status: Abnormal   Collection Time: 10/16/20  6:27 PM  Result Value Ref Range   Sodium 134 (L) 135 - 145 mmol/L   Potassium 4.0 3.5 - 5.1 mmol/L   Chloride 105 98 - 111 mmol/L   CO2 21 (L) 22 - 32 mmol/L   Glucose, Bld 75 70 - 99 mg/dL   BUN 12 6 - 20 mg/dL   Creatinine, Ser 12/16/20 0.44 - 1.00 mg/dL   Calcium 8.5 (L) 8.9 - 10.3 mg/dL   Total Protein 6.1 (L) 6.5 - 8.1 g/dL   Albumin 2.9 (L)  3.5 - 5.0 g/dL   AST 18 15 - 41 U/L   ALT 12 0 - 44 U/L   Alkaline Phosphatase 78 38 - 126 U/L   Total Bilirubin 0.6 0.3 - 1.2 mg/dL   GFR, Estimated 0.86 >57 mL/min   Anion gap 8 5 - 15  Resp Panel by RT-PCR (Flu A&B, Covid) Nasopharyngeal Swab     Status: None   Collection Time: 10/16/20  6:40 PM   Specimen: Nasopharyngeal Swab; Nasopharyngeal(NP) swabs in vial transport medium  Result Value Ref Range   SARS Coronavirus 2 by RT PCR NEGATIVE NEGATIVE   Influenza A by PCR NEGATIVE NEGATIVE   Influenza B by PCR NEGATIVE NEGATIVE  Protein / creatinine ratio, urine     Status: Abnormal   Collection Time: 10/16/20  6:40 PM  Result Value Ref Range   Creatinine, Urine 130 mg/dL   Total Protein, Urine 55 mg/dL   Protein Creatinine Ratio 0.42 (H) 0.00 - 0.15 mg/mg[Cre]  ABO/Rh     Status: None   Collection Time: 10/16/20  6:40 PM  Result Value Ref Range   ABO/RH(D)  A NEG Performed at [facility], [address], [place], [place] [address]     Assessment:   25 y.o. [name] [redacted]w[redacted]d prelabor rupture of membranes  Plan:   1) Labor - no cervical change since admission, will start pitocin augmentation  2) Fetus - Cat I tracing  3) Borderline BP - P/C ratio elevated but labs otherwise normal.    [name], MD, [facility], [facility] 10/16/2020, 9:33 PM

## 2020-10-16 NOTE — H&P (Signed)
Obstetric H&P   Chief Complaint: Leaking fluid  Prenatal Care Provider: [facility]  History of Present Illness: 25 y.o. [name] [redacted]w[redacted]d by 10/31/2020, by Last Menstrual Period presenting to L&D with LOF at 15:30 today.  +FM, +contractions, no vaginal bleeding.  Pregnancy uncomplicated other than anemia.     Pregravid weight 67.1 kg Total Weight Gain 28.1 kg  pregnancy 1 Problems (from 04/21/20 to present)     Problem Noted Resolved   Rh negative status during pregnancy 08/10/2020 by Conard Novak, MD No   Encounter for supervision of normal first pregnancy in first trimester 04/21/2020 by Natale Milch, MD No   Overview Addendum 10/15/2020 10:14 AM by Mirna Mires, CNM     Nursing Staff Provider  Office Location  [facility] Dating  [place] 13 weeks  Language  English Anatomy US  07/20/2020  Flu Vaccine  2021 Genetic Screen  NIPS: nml XY  TDaP vaccine   09/07/20 Hgb A1C or  GTT Third trimester :   Covid Vaccinated Had covid after vaccination jan 2022   LAB RESULTS   Rhogam  08/10/20 Blood Type A/Negative/-- (03/08 1101)   Feeding Plan breast Antibody Negative (06/27 1143)  Contraception pills Rubella <0.90 (03/08 1101)  Circumcision  RPR Non Reactive (06/27 1143)   Pediatrician   HBsAg Negative (03/08 1101)   Support Person Pennie Rushing- FOB HIV Non Reactive (06/27 1143)  Prenatal Classes Discussed Varicella NON IMMUNE    GBS Negative/-- (08/26 1636)(For PCN allergy, check sensitivities)   BTL Consent no    VBAC Consent n/a Pap  LGSIL 02/2020, ASCUS 2021 - repeat pap  In 2023 per [facility]    Colpo                Review of Systems: 10 point review of systems negative unless otherwise noted in HPI  Past Medical History: Patient Active Problem List   Diagnosis Date Noted   Normal labor 10/16/2020   Labor and delivery indication for care or intervention 10/13/2020   Rh negative status during pregnancy 08/10/2020   Encounter for supervision of normal first pregnancy in first trimester  04/21/2020     Nursing Staff Provider  Office Location  [facility] Dating  [place] 13 weeks  Language  English Anatomy US    Flu Vaccine  2021 Genetic Screen  NIPS: nml XY  TDaP vaccine   09/07/20 Hgb A1C or  GTT Third trimester :   Covid Vaccinated Had covid after vaccination jan 2022   LAB RESULTS   Rhogam  08/10/20 Blood Type A/Negative/-- (03/08 1101)   Feeding Plan breast Antibody Negative (06/27 1143)  Contraception pills Rubella <0.90 (03/08 1101)  Circumcision  RPR Non Reactive (06/27 1143)   Pediatrician   HBsAg Negative (03/08 1101)   Support Person Pennie Rushing- FOB HIV Non Reactive (06/27 1143)  Prenatal Classes Discussed Varicella NON IMMUNE    GBS Negative/-- (08/26 1636)(For PCN allergy, check sensitivities)   BTL Consent no    VBAC Consent n/a Pap  LGSIL 02/2020, ASCUS 2021 - repeat pap  In 2023 per [facility]    Colpo          Past Surgical History: Past Surgical History:  Procedure Laterality Date   NO PAST SURGERIES      Past Obstetric History: # 1 - Date: None, Sex: None, Weight: None, GA: None, Delivery: None, Apgar1: None, Apgar5: None, Living: None, Birth Comments: None   Past Gynecologic History:  Family History: Family History  Problem Relation Age  of Onset   Angina Father     Social History: Social History   Socioeconomic History   Marital status: Single    Spouse name: Not on file   Number of children: Not on file   Years of education: Not on file   Highest education level: Not on file  Occupational History   Not on file  Tobacco Use   Smoking status: Never   Smokeless tobacco: Never  Vaping Use   Vaping Use: Never used  Substance and Sexual Activity   Alcohol use: Not Currently   Drug use: Never   Sexual activity: Yes    Partners: Male    Birth control/protection: Other-see comments    Comment: undecided  Other Topics Concern   Not on file  Social History Narrative   Not on file   Social Determinants of Health   Financial Resource Strain:  Not on file  Food Insecurity: Not on file  Transportation Needs: Not on file  Physical Activity: Not on file  Stress: Not on file  Social Connections: Not on file  Intimate Partner Violence: Not on file    Medications: Prior to Admission medications   Medication Sig Start Date End Date Taking? Authorizing Provider  ferrous sulfate 325 (65 FE) MG EC tablet Take 325 mg by mouth 3 (three) times daily with meals.   Yes [provider]  Prenatal Vit-Fe Fumarate-FA (PRENATAL MULTIVITAMIN) TABS tablet Take 1 tablet by mouth daily at 12 noon.   Yes [provider]    Allergies: No Known Allergies  Physical Exam: Vitals: Blood pressure 137/79, pulse 98, temperature 98.4 F (36.9 C), temperature source Oral, resp. rate 18, height 5\' 4"  (1.626 m), weight 95.3 kg, last menstrual period 01/25/2020, SpO2 99 %.   FHT: 130, moderate, +accels, no decels Toco: q73min  General: NAD HEENT: normocephalic, anicteric Pulmonary: No increased work of breathing Cardiovascular: RRR, distal pulses 2+ Abdomen: Gravid, non-tender Leopolds:vtx Genitourinary: 3cm and grossly ruptured per nursing staff  Extremities: no edema, erythema, or tenderness Neurologic: Grossly intact Psychiatric: mood appropriate, affect full  Labs: No results found for this or any previous visit (from the past 24 hour(s)).  Assessment: 25 y.o. [name] [redacted]w[redacted]d by 10/31/2020, by Last Menstrual Period term labor  Plan: 1) Admit for term labor  2) Fetus - cat I  3) PNL - Blood type A/Negative/-- (03/08 1101) / Anti-bodyscreen Negative (06/27 1143) / Rubella <0.90 (03/08 1101) / Varicella Immune / RPR Non Reactive (06/27 1143) / HBsAg Negative (03/08 1101) / HIV Non Reactive (06/27 1143) / 1-hr OGTT 111 / GBS Negative/-- (08/26 1636)  4) Immunization History -  Immunization History  Administered Date(s) Administered   PFIZER(Purple Top)SARS-COV-2 Vaccination 09/04/2019, 09/25/2019   Tdap 09/07/2020    5)  Disposition - pending delivery  09/09/2020, MD, [name] OB/GYN, [facility] 10/16/2020, 6:39 PM

## 2020-10-16 NOTE — OB Triage Note (Signed)
Pt presenst c/o of LOF since 3:50 pm. Pt states fluid is clear in color. Pt denies bleeding. Pt cervix is 3/80/-3 and is grossly ruptured with clear fluid. Initial BP elevated137/79. Brisks reflexes. No clonus. Pt denies HA, blurry vision, or epigastric pain. All other VSS. Will continue to monitor.

## 2020-10-17 ENCOUNTER — Inpatient Hospital Stay: Payer: Medicaid Other | Admitting: Anesthesiology

## 2020-10-17 DIAGNOSIS — Z3A38 38 weeks gestation of pregnancy: Secondary | ICD-10-CM | POA: Diagnosis not present

## 2020-10-17 LAB — TYPE AND SCREEN
ABO/RH(D): A NEG
Antibody Screen: POSITIVE

## 2020-10-17 LAB — RPR: RPR Ser Ql: NONREACTIVE

## 2020-10-17 MED ORDER — WITCH HAZEL-GLYCERIN EX PADS
1.0000 "application " | MEDICATED_PAD | CUTANEOUS | Status: DC | PRN
  Filled 2020-10-17 (×2): qty 100

## 2020-10-17 MED ORDER — IBUPROFEN 600 MG PO TABS
ORAL_TABLET | ORAL | Status: AC
  Administered 2020-10-17: 600 mg via ORAL
  Filled 2020-10-17: qty 1

## 2020-10-17 MED ORDER — DIBUCAINE (PERIANAL) 1 % EX OINT
1.0000 "application " | TOPICAL_OINTMENT | CUTANEOUS | Status: DC | PRN
  Filled 2020-10-17: qty 28

## 2020-10-17 MED ORDER — DIPHENHYDRAMINE HCL 25 MG PO CAPS: 25.0000 mg | ORAL_CAPSULE | Freq: Four times a day (QID) | ORAL | Status: DC | PRN

## 2020-10-17 MED ORDER — FENTANYL-BUPIVACAINE-NACL 0.5-0.125-0.9 MG/250ML-% EP SOLN
EPIDURAL | Status: AC
  Filled 2020-10-17: qty 250

## 2020-10-17 MED ORDER — SIMETHICONE 80 MG PO CHEW: 80.0000 mg | CHEWABLE_TABLET | ORAL | Status: DC | PRN

## 2020-10-17 MED ORDER — SENNOSIDES-DOCUSATE SODIUM 8.6-50 MG PO TABS
2.0000 | ORAL_TABLET | Freq: Every day | ORAL | Status: DC
  Administered 2020-10-18: 2 via ORAL
  Filled 2020-10-17: qty 2

## 2020-10-17 MED ORDER — FENTANYL-BUPIVACAINE-NACL 0.5-0.125-0.9 MG/250ML-% EP SOLN
EPIDURAL | Status: DC | PRN
  Administered 2020-10-17: 12 mL/h via EPIDURAL

## 2020-10-17 MED ORDER — LIDOCAINE-EPINEPHRINE (PF) 1.5 %-1:200000 IJ SOLN
INTRAMUSCULAR | Status: DC | PRN
  Administered 2020-10-17: 3 mL via EPIDURAL

## 2020-10-17 MED ORDER — LIDOCAINE HCL (PF) 1 % IJ SOLN
INTRAMUSCULAR | Status: DC | PRN
  Administered 2020-10-17: 3 mL

## 2020-10-17 MED ORDER — ONDANSETRON HCL 4 MG/2ML IJ SOLN: 4.0000 mg | INTRAMUSCULAR | Status: DC | PRN

## 2020-10-17 MED ORDER — ACETAMINOPHEN 325 MG PO TABS
650.0000 mg | ORAL_TABLET | ORAL | Status: DC | PRN
  Administered 2020-10-17: 650 mg via ORAL
  Filled 2020-10-17: qty 2

## 2020-10-17 MED ORDER — BUPIVACAINE HCL (PF) 0.25 % IJ SOLN
INTRAMUSCULAR | Status: DC | PRN
  Administered 2020-10-17: 4 mL via EPIDURAL
  Administered 2020-10-17: 5 mL via EPIDURAL

## 2020-10-17 MED ORDER — ONDANSETRON HCL 4 MG PO TABS
4.0000 mg | ORAL_TABLET | ORAL | Status: DC | PRN
  Filled 2020-10-17: qty 1

## 2020-10-17 MED ORDER — PRENATAL MULTIVITAMIN CH
1.0000 | ORAL_TABLET | Freq: Every day | ORAL | Status: DC
  Administered 2020-10-18: 1 via ORAL
  Filled 2020-10-17: qty 1

## 2020-10-17 MED ORDER — COCONUT OIL OIL
1.0000 "application " | TOPICAL_OIL | Status: DC | PRN
  Filled 2020-10-17 (×2): qty 120

## 2020-10-17 MED ORDER — BENZOCAINE-MENTHOL 20-0.5 % EX AERO
1.0000 "application " | INHALATION_SPRAY | CUTANEOUS | Status: DC | PRN
  Administered 2020-10-17: 1 via TOPICAL
  Filled 2020-10-17: qty 56

## 2020-10-17 MED ORDER — IBUPROFEN 600 MG PO TABS
600.0000 mg | ORAL_TABLET | Freq: Four times a day (QID) | ORAL | Status: DC
  Administered 2020-10-17 – 2020-10-18 (×5): 600 mg via ORAL
  Filled 2020-10-17 (×4): qty 1

## 2020-10-17 NOTE — Anesthesia Procedure Notes (Signed)
Epidural Patient location during procedure: OB  Staffing Anesthesiologist: Piscitello, Cleda Mccreedy, MD Performed: anesthesiologist   Preanesthetic Checklist Completed: patient identified, IV checked, site marked, risks and benefits discussed, surgical consent, monitors and equipment checked, pre-op evaluation and timeout performed  Epidural Patient position: sitting Prep: ChloraPrep Patient monitoring: heart rate, continuous pulse ox and blood pressure Approach: midline Location: L4-L5 Injection technique: LOR saline  Needle:  Needle type: Tuohy  Needle gauge: 17 G Needle length: 9 cm and 9 Needle insertion depth: 6 cm Catheter type: closed end flexible Catheter size: 19 Gauge Catheter at skin depth: 12 cm Test dose: negative and 1.5% lidocaine with Epi 1:200 K  Assessment Sensory level: T10 Events: blood not aspirated, injection not painful, no injection resistance, no paresthesia and negative IV test  Additional Notes 1st attempt Pt. Evaluated and documentation done after procedure finished. Patient identified. Risks/Benefits/Options discussed with patient including but not limited to bleeding, infection, nerve damage, paralysis, failed block, incomplete pain control, headache, blood pressure changes, nausea, vomiting, reactions to medication both or allergic, itching and postpartum back pain. Confirmed with bedside nurse the patient's most recent platelet count. Confirmed with patient that they are not currently taking any anticoagulation, have any bleeding history or any family history of bleeding disorders. Patient expressed understanding and wished to proceed. All questions were answered. Sterile technique was used throughout the entire procedure. Please see nursing notes for vital signs. Test dose was given through epidural catheter and negative prior to continuing to dose epidural or start infusion. Warning signs of high block given to the patient including shortness of  breath, tingling/numbness in hands, complete motor block, or any concerning symptoms with instructions to call for help. Patient was given instructions on fall risk and not to get out of bed. All questions and concerns addressed with instructions to call with any issues or inadequate analgesia.    Patient tolerated the insertion well without immediate complications.Reason for block:procedure for pain

## 2020-10-17 NOTE — Progress Notes (Signed)
Subjective:  Feels contractions stonger  Objective:   Vitals: Blood pressure 119/70, pulse (!) 117, temperature 97.9 F (36.6 C), temperature source Axillary, resp. rate 18, height 5\' 4"  (1.626 m), weight 95.3 kg, last menstrual period 01/25/2020, SpO2 99 %. General: NAD Abdomen: gravid, non-tender Cervical Exam:  Dilation: 3 Effacement (%): 90 Cervical Position: Middle Station: -3 Presentation: Vertex Exam by:: [email or web address], MD  FHT: 140, moderate, +accels, no decels Toco:q14min  Results for orders placed or performed during the hospital encounter of 10/16/20 (from the past 24 hour(s))  CBC     Status: Abnormal   Collection Time: 10/16/20  6:27 PM  Result Value Ref Range   WBC 9.2 4.0 - 10.5 K/uL   RBC 3.74 (L) 3.87 - 5.11 MIL/uL   Hemoglobin 11.5 (L) 12.0 - 15.0 g/dL   HCT 12/16/20 (L) 28.3 - 15.1 %   MCV 91.7 80.0 - 100.0 fL   MCH 30.7 26.0 - 34.0 pg   MCHC 33.5 30.0 - 36.0 g/dL   RDW 76.1 60.7 - 37.1 %   Platelets 230 150 - 400 K/uL   nRBC 0.0 0.0 - 0.2 %  Type and screen [facility]     Status: None   Collection Time: 10/16/20  6:27 PM  Result Value Ref Range   ABO/RH(D) A NEG    Antibody Screen POS    Sample Expiration 10/19/2020,2359    Antibody Identification PASSIVELY ACQUIRED ANTI-D   Comprehensive metabolic panel     Status: Abnormal   Collection Time: 10/16/20  6:27 PM  Result Value Ref Range   Sodium 134 (L) 135 - 145 mmol/L   Potassium 4.0 3.5 - 5.1 mmol/L   Chloride 105 98 - 111 mmol/L   CO2 21 (L) 22 - 32 mmol/L   Glucose, Bld 75 70 - 99 mg/dL   BUN 12 6 - 20 mg/dL   Creatinine, Ser 12/16/20 0.44 - 1.00 mg/dL   Calcium 8.5 (L) 8.9 - 10.3 mg/dL   Total Protein 6.1 (L) 6.5 - 8.1 g/dL   Albumin 2.9 (L) 3.5 - 5.0 g/dL   AST 18 15 - 41 U/L   ALT 12 0 - 44 U/L   Alkaline Phosphatase 78 38 - 126 U/L   Total Bilirubin 0.6 0.3 - 1.2 mg/dL   GFR, Estimated 6.94 >85 mL/min   Anion gap 8 5 - 15  Resp Panel by RT-PCR (Flu A&B, Covid)  Nasopharyngeal Swab     Status: None   Collection Time: 10/16/20  6:40 PM   Specimen: Nasopharyngeal Swab; Nasopharyngeal(NP) swabs in vial transport medium  Result Value Ref Range   SARS Coronavirus 2 by RT PCR NEGATIVE NEGATIVE   Influenza A by PCR NEGATIVE NEGATIVE   Influenza B by PCR NEGATIVE NEGATIVE  Protein / creatinine ratio, urine     Status: Abnormal   Collection Time: 10/16/20  6:40 PM  Result Value Ref Range   Creatinine, Urine 130 mg/dL   Total Protein, Urine 55 mg/dL   Protein Creatinine Ratio 0.42 (H) 0.00 - 0.15 mg/mg[Cre]  ABO/Rh     Status: None   Collection Time: 10/16/20  6:40 PM  Result Value Ref Range   ABO/RH(D)      A NEG Performed at [facility], [address] [place], [place]     Assessment:   25 y.o. [name] [redacted]w[redacted]d with prelabor rupture of membranes  Plan:   1) Labor - no cervical change, patient intolerant of attempt to  place IUPC will have patient get epidural re-attempt IUPC placement to aid in titrating pitocin  2) Fetus - cat I tracing  [name], MD, [facility], [facility] 10/17/2020, 12:33 AM

## 2020-10-17 NOTE — Progress Notes (Signed)
Arm still shaking from epidural.

## 2020-10-17 NOTE — Progress Notes (Signed)
Subjective:  Comfortable epidural in place  Objective:   Vitals: Blood pressure (!) 107/58, pulse 90, temperature 98.3 F (36.8 C), temperature source Oral, resp. rate 17, height 5\' 4"  (1.626 m), weight 95.3 kg, last menstrual period 01/25/2020, SpO2 98 %. General: NAD Abdomen: gravid, non-tender Cervical Exam:  Dilation: 8 Effacement (%): 100 Cervical Position: Middle Station: Plus 2 Presentation: Vertex Exam by:: [email or web address], MD  FHT: 150, moderate, +accels, run of lates ~0450 resolved with positiong chaning Toco: q2-62min  Results for orders placed or performed during the hospital encounter of 10/16/20 (from the past 24 hour(s))  CBC     Status: Abnormal   Collection Time: 10/16/20  6:27 PM  Result Value Ref Range   WBC 9.2 4.0 - 10.5 K/uL   RBC 3.74 (L) 3.87 - 5.11 MIL/uL   Hemoglobin 11.5 (L) 12.0 - 15.0 g/dL   HCT 12/16/20 (L) 15.4 - 00.8 %   MCV 91.7 80.0 - 100.0 fL   MCH 30.7 26.0 - 34.0 pg   MCHC 33.5 30.0 - 36.0 g/dL   RDW 67.6 19.5 - 09.3 %   Platelets 230 150 - 400 K/uL   nRBC 0.0 0.0 - 0.2 %  Type and screen [facility]     Status: None   Collection Time: 10/16/20  6:27 PM  Result Value Ref Range   ABO/RH(D) A NEG    Antibody Screen POS    Sample Expiration 10/19/2020,2359    Antibody Identification PASSIVELY ACQUIRED ANTI-D   Comprehensive metabolic panel     Status: Abnormal   Collection Time: 10/16/20  6:27 PM  Result Value Ref Range   Sodium 134 (L) 135 - 145 mmol/L   Potassium 4.0 3.5 - 5.1 mmol/L   Chloride 105 98 - 111 mmol/L   CO2 21 (L) 22 - 32 mmol/L   Glucose, Bld 75 70 - 99 mg/dL   BUN 12 6 - 20 mg/dL   Creatinine, Ser 12/16/20 0.44 - 1.00 mg/dL   Calcium 8.5 (L) 8.9 - 10.3 mg/dL   Total Protein 6.1 (L) 6.5 - 8.1 g/dL   Albumin 2.9 (L) 3.5 - 5.0 g/dL   AST 18 15 - 41 U/L   ALT 12 0 - 44 U/L   Alkaline Phosphatase 78 38 - 126 U/L   Total Bilirubin 0.6 0.3 - 1.2 mg/dL   GFR, Estimated 1.24 >58 mL/min   Anion gap 8 5 - 15   Resp Panel by RT-PCR (Flu A&B, Covid) Nasopharyngeal Swab     Status: None   Collection Time: 10/16/20  6:40 PM   Specimen: Nasopharyngeal Swab; Nasopharyngeal(NP) swabs in vial transport medium  Result Value Ref Range   SARS Coronavirus 2 by RT PCR NEGATIVE NEGATIVE   Influenza A by PCR NEGATIVE NEGATIVE   Influenza B by PCR NEGATIVE NEGATIVE  Protein / creatinine ratio, urine     Status: Abnormal   Collection Time: 10/16/20  6:40 PM  Result Value Ref Range   Creatinine, Urine 130 mg/dL   Total Protein, Urine 55 mg/dL   Protein Creatinine Ratio 0.42 (H) 0.00 - 0.15 mg/mg[Cre]  ABO/Rh     Status: None   Collection Time: 10/16/20  6:40 PM  Result Value Ref Range   ABO/RH(D)      A NEG Performed at [facility], [address], [place], [place]     Assessment:   25 y.o. [name] [redacted]w[redacted]d term labor  Plan:   1) Labor - good cervical  change, continue pitocin  2) Fetus - cat II tracing, has had some late decels but good scalp stimulation and rapid cervical change  [name], MD, [facility], [facility] 10/17/2020, 5:12 AM

## 2020-10-17 NOTE — Discharge Summary (Signed)
OB Discharge Summary     Patient Name: Vanessa Rodgers DOB: 1995/08/08 MRN: [id number]  Date of admission: 10/16/2020 Delivering provider: Tresea Mall, CNM  Date of Delivery: 10/17/2020  Date of discharge: 10/18/2020  Admitting diagnosis: Normal labor [O80, Z37.9] Intrauterine pregnancy: [redacted]w[redacted]d     Secondary diagnosis: None     Discharge diagnosis: Term Pregnancy Delivered                                                                                                Post partum procedures: rhogam   Augmentation: Pitocin  Complications: None  Hospital course:  Onset of Labor With Vaginal Delivery      25 y.o. yo [name] was admitted in Latent Labor on 10/16/2020. Patient had an uncomplicated labor course as follows:  Membrane Rupture Time/Date: 3:50 PM ,10/16/2020   Delivery Method:Vaginal, Spontaneous  Episiotomy: None  Lacerations:  2nd degree  See delivery note for details  Patient had an uncomplicated postpartum course.   She is tolerating regular diet, her pain is well controlled with PO medication, she is ambulating and voiding without difficulty. Baby has nursed well prior to discharge.  Patient is discharged home in stable condition on 10/18/20.  Newborn Data: Birth date:10/17/2020  Birth time:9:17 AM  Gender:Female  [place] Living status:Living  Apgars:8 ,9  Weight:4030 g, 8 pounds 14 ounces  Physical exam  Vitals:   10/17/20 1700 10/17/20 1930 10/18/20 0023 10/18/20 0850  BP: 116/80 127/78 124/75 101/61  Pulse: (!) 101 (!) 108 93 85  Resp: 16 19 17 20   Temp: 99.3 F (37.4 C) 98.7 F (37.1 C) 99 F (37.2 C) 98 F (36.7 C)  TempSrc: Oral Oral Oral Oral  SpO2:  98% 98% 99%  Weight:      Height:       General: alert, cooperative, and no distress Lochia: appropriate Uterine Fundus: firm Incision: N/A DVT Evaluation: No evidence of DVT seen on physical exam.  Labs: Lab Results  Component Value Date   WBC 12.6 (H) 10/18/2020   HGB 9.8 (L) 10/18/2020    HCT 29.2 (L) 10/18/2020   MCV 93.3 10/18/2020   PLT 182 10/18/2020    Discharge instruction: per After Visit Summary.  Medications:  Allergies as of 10/18/2020   No Known Allergies      Medication List     TAKE these medications    ferrous sulfate 325 (65 FE) MG EC tablet Take 325 mg by mouth 3 (three) times daily with meals.   prenatal multivitamin Tabs tablet Take 1 tablet by mouth daily at 12 noon.        Diet: routine diet  Activity: Advance as tolerated. Pelvic rest for 6 weeks.   Outpatient follow up:  Follow-up Information     12/18/2020, CNM. Schedule an appointment as soon as possible for a visit in 6 week(s).   Specialty: Obstetrics Why: postpartum visit Repeat PAP in January 2023 Contact information: [address] [place] [phone]                   Postpartum contraception:  Progesterone only pills Rhogam Given postpartum: yes Rubella vaccine given postpartum: declined Varicella vaccine given postpartum: declined TDaP given antepartum or postpartum: given antepartum   Newborn Delivery   Birth date/time: 10/17/2020 09:17:00 Delivery type: Vaginal, Spontaneous       Baby Feeding: Breast  Disposition:home with mother  SIGNED:  Tresea Mall, CNM 10/18/2020 3:16 PM

## 2020-10-18 ENCOUNTER — Encounter: Payer: Self-pay | Admitting: *Deleted

## 2020-10-18 LAB — CBC
HCT: 29.2 % — ABNORMAL LOW (ref 36.0–46.0)
Hemoglobin: 9.8 g/dL — ABNORMAL LOW (ref 12.0–15.0)
MCH: 31.3 pg (ref 26.0–34.0)
MCHC: 33.6 g/dL (ref 30.0–36.0)
MCV: 93.3 fL (ref 80.0–100.0)
Platelets: 182 10*3/uL (ref 150–400)
RBC: 3.13 MIL/uL — ABNORMAL LOW (ref 3.87–5.11)
RDW: 15.3 % (ref 11.5–15.5)
WBC: 12.6 10*3/uL — ABNORMAL HIGH (ref 4.0–10.5)
nRBC: 0 % (ref 0.0–0.2)

## 2020-10-18 LAB — FETAL SCREEN: Fetal Screen: NEGATIVE

## 2020-10-18 MED ORDER — RHO D IMMUNE GLOBULIN 1500 UNIT/2ML IJ SOSY
300.0000 ug | PREFILLED_SYRINGE | Freq: Once | INTRAMUSCULAR | Status: AC
  Administered 2020-10-18: 300 ug via INTRAVENOUS
  Filled 2020-10-18: qty 2

## 2020-10-18 NOTE — Progress Notes (Signed)
Subjective:  Doing well postpartum day 1: she is tolerating regular diet, her pain is controlled with PO medication, she is ambulating and voiding without difficulty. She reports some challenges with breastfeeding. Baby has had mucousy spit up and is not latching as well. Patient desires discharge to home today if newborn is discharged by Peds.   Objective:  Vital signs in last 24 hours: Temp:  [98 F (36.7 C)-99.3 F (37.4 C)] 98 F (36.7 C) (09/04 0850) Pulse Rate:  [85-108] 85 (09/04 0850) Resp:  [16-20] 20 (09/04 0850) BP: (101-127)/(61-80) 101/61 (09/04 0850) SpO2:  [98 %-99 %] 99 % (09/04 0850)    General: NAD Pulmonary: no increased work of breathing Abdomen: non-distended, non-tender, fundus firm at level of umbilicus Extremities: no edema, no erythema, no tenderness  Results for orders placed or performed during the hospital encounter of 10/16/20 (from the past 72 hour(s))  CBC     Status: Abnormal   Collection Time: 10/16/20  6:27 PM  Result Value Ref Range   WBC 9.2 4.0 - 10.5 K/uL   RBC 3.74 (L) 3.87 - 5.11 MIL/uL   Hemoglobin 11.5 (L) 12.0 - 15.0 g/dL   HCT 34.3 (L) 36.0 - 46.0 %   MCV 91.7 80.0 - 100.0 fL   MCH 30.7 26.0 - 34.0 pg   MCHC 33.5 30.0 - 36.0 g/dL   RDW 14.7 11.5 - 15.5 %   Platelets 230 150 - 400 K/uL   nRBC 0.0 0.0 - 0.2 %    Comment: Performed at [facility], [address] [place], [place] [address]  Type and screen [place]     Status: None   Collection Time: 10/16/20  6:27 PM  Result Value Ref Range   ABO/RH(D) A NEG    Antibody Screen POS    Sample Expiration 10/19/2020,2359    Antibody Identification PASSIVELY ACQUIRED ANTI-D   RPR     Status: None   Collection Time: 10/16/20  6:27 PM  Result Value Ref Range   RPR Ser Ql NON REACTIVE NON REACTIVE    Comment: Performed at [facility], [place] [address] [place], [place] [address]  Comprehensive metabolic panel     Status: Abnormal   Collection  Time: 10/16/20  6:27 PM  Result Value Ref Range   Sodium 134 (L) 135 - 145 mmol/L   Potassium 4.0 3.5 - 5.1 mmol/L   Chloride 105 98 - 111 mmol/L   CO2 21 (L) 22 - 32 mmol/L   Glucose, Bld 75 70 - 99 mg/dL    Comment: Glucose reference range applies only to samples taken after fasting for at least 8 hours.   BUN 12 6 - 20 mg/dL   Creatinine, Ser 0.62 0.44 - 1.00 mg/dL   Calcium 8.5 (L) 8.9 - 10.3 mg/dL   Total Protein 6.1 (L) 6.5 - 8.1 g/dL   Albumin 2.9 (L) 3.5 - 5.0 g/dL   AST 18 15 - 41 U/L   ALT 12 0 - 44 U/L   Alkaline Phosphatase 78 38 - 126 U/L   Total Bilirubin 0.6 0.3 - 1.2 mg/dL   GFR, Estimated >60 >60 mL/min    Comment: (NOTE) Calculated using the CKD-EPI Creatinine Equation (2021)    Anion gap 8 5 - 15    Comment: Performed at [facility], [address] [place], [place] [address]  Resp Panel by RT-PCR (Flu A&B, Covid) Nasopharyngeal Swab     Status: None  Collection Time: 10/16/20  6:40 PM   Specimen: Nasopharyngeal Swab; Nasopharyngeal(NP) swabs in vial transport medium  Result Value Ref Range   SARS Coronavirus 2 by RT PCR NEGATIVE NEGATIVE    Comment: (NOTE) SARS-CoV-2 target nucleic acids are NOT DETECTED.  The SARS-CoV-2 RNA is generally detectable in upper respiratory specimens during the acute phase of infection. The lowest concentration of SARS-CoV-2 viral copies this assay can detect is 138 copies/mL. A negative result does not preclude SARS-Cov-2 infection and should not be used as the sole basis for treatment or other patient management decisions. A negative result may occur with  improper specimen collection/handling, submission of specimen other than nasopharyngeal swab, presence of viral mutation(s) within the areas targeted by this assay, and inadequate number of viral copies(<138 copies/mL). A negative result must be combined with clinical observations, patient history, and epidemiological information. The expected result is  Negative.  Fact Sheet for Patients:  EntrepreneurPulse.com.au  Fact Sheet for Healthcare Providers:  IncredibleEmployment.be  This test is no t yet approved or cleared by the [place] FDA and  has been authorized for detection and/or diagnosis of SARS-CoV-2 by FDA under an Emergency Use Authorization (EUA). This EUA will remain  in effect (meaning this test can be used) for the duration of the COVID-19 declaration under Section 564(b)(1) of the Act, 21 U.S.C.section 360bbb-3(b)(1), unless the authorization is terminated  or revoked sooner.       Influenza A by PCR NEGATIVE NEGATIVE   Influenza B by PCR NEGATIVE NEGATIVE    Comment: (NOTE) The Xpert Xpress SARS-CoV-2/FLU/RSV plus assay is intended as an aid in the diagnosis of influenza from Nasopharyngeal swab specimens and should not be used as a sole basis for treatment. Nasal washings and aspirates are unacceptable for Xpert Xpress SARS-CoV-2/FLU/RSV testing.  Fact Sheet for Patients: EntrepreneurPulse.com.au  Fact Sheet for Healthcare Providers: IncredibleEmployment.be  This test is not yet approved or cleared by the [place] FDA and has been authorized for detection and/or diagnosis of SARS-CoV-2 by FDA under an Emergency Use Authorization (EUA). This EUA will remain in effect (meaning this test can be used) for the duration of the COVID-19 declaration under Section 564(b)(1) of the Act, 21 U.S.C. section 360bbb-3(b)(1), unless the authorization is terminated or revoked.  Performed at [facility], [address] [place], [place] [address]   Protein / creatinine ratio, urine     Status: Abnormal   Collection Time: 10/16/20  6:40 PM  Result Value Ref Range   Creatinine, Urine 130 mg/dL   Total Protein, Urine 55 mg/dL    Comment: NO NORMAL RANGE ESTABLISHED FOR THIS TEST   Protein Creatinine Ratio 0.42 (H) 0.00 - 0.15  mg/mg[Cre]    Comment: Performed at [facility], [address] [place], [place] [address]  ABO/Rh     Status: None   Collection Time: 10/16/20  6:40 PM  Result Value Ref Range   ABO/RH(D)      A NEG Performed at [facility], [address], [place], [place] [address]   Fetal screen     Status: None   Collection Time: 10/18/20  6:35 AM  Result Value Ref Range   Fetal Screen      NEG Performed at [facility], [address] [place], [place] [address]   Rhogam injection     Status: None (Preliminary result)   Collection Time: 10/18/20  6:35 AM  Result Value Ref Range   Unit Number [id number]  Blood Component Type RHIG    Unit division 00    Status of Unit ISSUED    Transfusion Status OK TO TRANSFUSE   CBC     Status: Abnormal   Collection Time: 10/18/20  6:35 AM  Result Value Ref Range   WBC 12.6 (H) 4.0 - 10.5 K/uL   RBC 3.13 (L) 3.87 - 5.11 MIL/uL   Hemoglobin 9.8 (L) 12.0 - 15.0 g/dL   HCT 29.2 (L) 36.0 - 46.0 %   MCV 93.3 80.0 - 100.0 fL   MCH 31.3 26.0 - 34.0 pg   MCHC 33.6 30.0 - 36.0 g/dL   RDW 15.3 11.5 - 15.5 %   Platelets 182 150 - 400 K/uL   nRBC 0.0 0.0 - 0.2 %    Comment: Performed at [facility], [address], [place], [place] [address]    Assessment:   25 y.o. [name] postpartum day # 1, lactating  Plan:    1) Acute blood loss anemia - hemodynamically stable and asymptomatic - po ferrous sulfate  2) Blood Type --/--/A NEG- Baby is A+ Rhogam indicated Performed at [facility], [address] [place], [place] [address]  [phone]) / Rubella <0.90 (03/08 1101) / Varicella Non-Immune She declines MMR and Varivax  3) TDAP status up to date  4) Feeding plan breast  5)  Education given regarding options for contraception, as well as compatibility with breast feeding if applicable.  Patient plans on oral progesterone-only contraceptive for contraception.  6) Disposition: continue current  care with possible discharge to home later today   Rod Can, [facility] 10/18/2020, 12:59 PM

## 2020-10-18 NOTE — Anesthesia Postprocedure Evaluation (Signed)
Anesthesia Post Note  Patient: Vanessa Rodgers  Procedure(s) Performed: AN AD HOC LABOR EPIDURAL  Patient location during evaluation: Mother Baby Anesthesia Type: Epidural Level of consciousness: awake and alert Pain management: pain level controlled Vital Signs Assessment: post-procedure vital signs reviewed and stable Respiratory status: spontaneous breathing, nonlabored ventilation and respiratory function stable Cardiovascular status: stable Postop Assessment: no headache, no backache and epidural receding Anesthetic complications: no   No notable events documented.   Last Vitals:  Vitals:   10/18/20 0023 10/18/20 0850  BP: 124/75 101/61  Pulse: 93 85  Resp: 17 20  Temp: 37.2 C 36.7 C  SpO2: 98% 99%    Last Pain:  Vitals:   10/18/20 0850  TempSrc: Oral  PainSc:                  Arianie Couse

## 2020-10-18 NOTE — Progress Notes (Signed)
Pt d/c'd home in the company of baby and significant other. D/c instructions reviewed with pt. Appt set up to repeat hearing screen. Pt aware of appt date. Pt verbalized understanding of d/c instructions.

## 2020-10-18 NOTE — Progress Notes (Signed)
Pt refused varicella and rubella vaccines.

## 2020-10-19 LAB — RHOGAM INJECTION: Unit division: 0

## 2020-10-23 ENCOUNTER — Encounter: Payer: Medicaid Other | Admitting: Advanced Practice Midwife

## 2020-10-30 ENCOUNTER — Encounter: Payer: Medicaid Other | Admitting: Obstetrics and Gynecology

## 2020-11-10 ENCOUNTER — Ambulatory Visit: Payer: Self-pay

## 2020-11-10 NOTE — Lactation Note (Signed)
This note was copied from a baby's chart. Lactation Consultation Note  Patient Name: Vanessa Rodgers [id number] Date: 11/10/2020 Reason for consult: Other (Comment) (Outpatient; feeding assessment) Age:24 wk.o.  Outpatient appointment. Referral from MD for poor weight gain. Birth Weight: 8lb 14oz Lowest weight: 7lb 11oz per mom Most recent weight: 8lb 13.5oz  Today's weight: 4034g  Maternal Data Has patient been taught Hand Expression?: Yes Does the patient have breastfeeding experience prior to this delivery?: No  Feeding Mother's Current Feeding Choice: Breast Milk  Mom is BF every 2hours from 6am to 11pm, and pumping post feedings/use of [place] on opposite breast while feeding. Mom is BF every 3hrs at night from 11pm-6am + 2.5oz of EBM via bottle for supplement due to low weight gain.  MD recommended each breast, and pump as current feeding practices, along with feeding every 2hr during the day, 3hrs at night plus supplement.  LATCH Score Latch: Grasps breast easily, tongue down, lips flanged, rhythmical sucking.  Audible Swallowing: Spontaneous and intermittent  Type of Nipple: Everted at rest and after stimulation  Comfort (Breast/Nipple): Soft / non-tender  Hold (Positioning): Assistance needed to correctly position infant at breast and maintain latch.  LATCH Score: 9  Baby fed for total of 31 minutes with total transfer of 28mL. LC gently corrected position with turning baby in towards mom and adding a support pillow to bring baby to breast level. Baby had audible swallows but also struggled to stay awake through feeding needing constant stimulation.   Lactation Tools Discussed/Used   DEBP- Lansinoh brand, fitted for size 44mm breast flanges- mom was using 30.75mm breast flange. Discussed impact the inappropriate size can have on stimulation and milk release.  Interventions Interventions: Breast feeding basics reviewed;Assisted with latch;Hand express;Support  pillows;DEBP;Education (flange fitting)  Discharge Discharge Education: Warning signs for feeding baby;Outpatient recommendation Pump: Personal (Lansinoh)  Mom encouraged to follow directive from pediatrician- continue frequent feedings with supplemental bottles of EBM at night to surpass birth weight, supplementation may be re-evaluated to determine need later on.  Pumping post feedings for 10+ minutes, power pumping AM/PM for 10on/10off for 3 days, hands on pumping, and hand expression pre/post pumping as options to help boost supply.  Consult Status Consult Status: Complete  Encouraged to call with questions/concerns.  Danford Bad 11/10/2020, 3:18 PM

## 2020-11-25 ENCOUNTER — Ambulatory Visit: Payer: Medicaid Other | Admitting: Advanced Practice Midwife

## 2021-01-15 ENCOUNTER — Encounter: Payer: Self-pay | Admitting: Obstetrics and Gynecology

## 2021-04-01 ENCOUNTER — Other Ambulatory Visit (HOSPITAL_COMMUNITY)
Admission: RE | Admit: 2021-04-01 | Discharge: 2021-04-01 | Disposition: A | Discharge status: Home / Self Care | Payer: Medicaid Other | Source: Ambulatory Visit | Attending: Advanced Practice Midwife | Admitting: Advanced Practice Midwife

## 2021-04-01 ENCOUNTER — Other Ambulatory Visit: Payer: Self-pay

## 2021-04-01 ENCOUNTER — Ambulatory Visit (INDEPENDENT_AMBULATORY_CARE_PROVIDER_SITE_OTHER): Payer: Medicaid Other | Admitting: Obstetrics

## 2021-04-01 ENCOUNTER — Encounter: Payer: Self-pay | Admitting: Obstetrics

## 2021-04-01 ENCOUNTER — Ambulatory Visit: Payer: Medicaid Other | Admitting: Advanced Practice Midwife

## 2021-04-01 VITALS — BP 118/66 | Ht 64.0 in | Wt 181.0 lb

## 2021-04-01 DIAGNOSIS — Z01419 Encounter for gynecological examination (general) (routine) without abnormal findings: Secondary | ICD-10-CM | POA: Insufficient documentation

## 2021-04-01 DIAGNOSIS — Z124 Encounter for screening for malignant neoplasm of cervix: Secondary | ICD-10-CM

## 2021-04-01 DIAGNOSIS — Z113 Encounter for screening for infections with a predominantly sexual mode of transmission: Secondary | ICD-10-CM | POA: Diagnosis present

## 2021-04-01 DIAGNOSIS — Z30011 Encounter for initial prescription of contraceptive pills: Secondary | ICD-10-CM | POA: Diagnosis not present

## 2021-04-01 MED ORDER — NORETHINDRONE 0.35 MG PO TABS: 1.0000 | ORAL_TABLET | Freq: Every day | ORAL | 1 refills | Status: AC

## 2021-04-01 MED ORDER — NORETHIN ACE-ETH ESTRAD-FE 1-20 MG-MCG PO TABS: 1.0000 | ORAL_TABLET | Freq: Every day | ORAL | 3 refills | Status: AC

## 2021-04-01 NOTE — Progress Notes (Signed)
Gynecology Annual Exam  PCP: [facility]  Chief Complaint: No chief complaint on file.   History of Present Illness:  Ms. Vanessa Rodgers is a 26 y.o. [name] who LMP was December 2021, presents today for her annual examination.  She is currently breastfeeding, and has not had her menses since giving birth. Menses are normally every 28 days, lasting 4 to 5 days.   She is single partner, contraception - none.  Last Pap: February 19, 2020  Results were: no abnormalities  Hx of STDs: HPV  Her FH is positive for breast cancer for her grandmother. There is no FH of ovarian cancer. The patient does do self-breast exams.  Tobacco use: The patient denies current or previous tobacco use. Exercise: exercises 2 times a week, by going to gym.    The patient wears seatbelts: yes.   The patient reports that domestic violence in her life is absent.   Past Medical History:  Diagnosis Date   Encounter for supervision of normal first pregnancy in third trimester 04/21/2020    Nursing Staff Provider Office Location  [facility] Dating  [place] 13 weeks Language  English Anatomy US   Flu Vaccine  2021 Genetic Screen  NIPS: nml XY TDaP vaccine   09/07/20 Hgb A1C or  GTT Third trimester :  Covid Vaccinated Had covid after vaccination jan 2022   LAB RESULTS  Rhogam  08/10/20 Blood Type A/Negative/-- (03/08 1101)  Feeding Plan breast Antibody Negative (06/27 1143) Contraception pills   No known health problems     Past Surgical History:  Procedure Laterality Date   NO PAST SURGERIES      Prior to Admission medications   Medication Sig Start Date End Date Taking? Authorizing Provider  ferrous sulfate 325 (65 FE) MG EC tablet Take 325 mg by mouth 3 (three) times daily with meals.    [provider]  Prenatal Vit-Fe Fumarate-FA (PRENATAL MULTIVITAMIN) TABS tablet Take 1 tablet by mouth daily at 12 noon.    [provider]    No Known Allergies  Gynecologic History: No LMP  recorded. History of abnormal pap smear: Yes History of STI: Yes   Obstetric History: [name]  Social History   Socioeconomic History   Marital status: Single    Spouse name: Not on file   Number of children: Not on file   Years of education: Not on file   Highest education level: Not on file  Occupational History   Not on file  Tobacco Use   Smoking status: Never   Smokeless tobacco: Never  Vaping Use   Vaping Use: Never used  Substance and Sexual Activity   Alcohol use: Not Currently   Drug use: Never   Sexual activity: Yes    Partners: Male    Birth control/protection: Other-see comments    Comment: undecided  Other Topics Concern   Not on file  Social History Narrative   Not on file   Social Determinants of Health   Financial Resource Strain: Not on file  Food Insecurity: Not on file  Transportation Needs: Not on file  Physical Activity: Not on file  Stress: Not on file  Social Connections: Not on file  Intimate Partner Violence: Not on file    Family History  Problem Relation Age of Onset   Angina Father     Review of Systems  Constitutional: Negative.   HENT: Negative.    Eyes: Negative.   Respiratory: Negative.    Cardiovascular:  Negative.   Gastrointestinal: Negative.   Genitourinary: Negative.   Musculoskeletal: Negative.   Skin: Negative.   Neurological: Negative.   Endo/Heme/Allergies: Negative.   Psychiatric/Behavioral:  The patient is nervous/anxious.        Patient reports some anxiety.     Physical Exam There were no vitals taken for this visit.   Physical Exam Constitutional:      Appearance: Normal appearance.  Genitourinary:     Genitourinary Comments: External exam reveals well-healed laceration. Speculum inserted without complaint to reveal pink, healthy cervix with some white discharge noted. Pap and Aptima collected. Bimanual exam performed, negative for CMT, adnexal tenderness.   HENT:     Head: Normocephalic and atraumatic.      Nose: Nose normal.  Cardiovascular:     Rate and Rhythm: Normal rate and regular rhythm.     Pulses: Normal pulses.     Heart sounds: Normal heart sounds.  Pulmonary:     Effort: Pulmonary effort is normal.     Breath sounds: Normal breath sounds.  Abdominal:     General: Abdomen is flat. Bowel sounds are normal.     Palpations: Abdomen is soft.  Musculoskeletal:        General: Normal range of motion.     Cervical back: Normal range of motion.  Neurological:     General: No focal deficit present.     Mental Status: She is alert and oriented to person, place, and time.  Skin:    General: Skin is warm and dry.     Capillary Refill: Capillary refill takes less than 2 seconds.  Psychiatric:        Mood and Affect: Mood normal.        Behavior: Behavior normal.        Thought Content: Thought content normal.        Judgment: Judgment normal.    Female chaperone present for pelvic and breast  portions of the physical exam  Results: PHQ-9: 3 GAD 7: 4   Assessment: 26 y.o. [name] female here for routine annual gynecologic examination  Plan: Problem List Items Addressed This Visit   None   Screening: -- Blood pressure screen normal -- Weight screening: overweight: continue to monitor -- Depression screening negative (PHQ-9) -- Nutrition: normal -- cholesterol screening: not due for screening -- osteoporosis screening: not due for screening.  -- tobacco screening: not using -- alcohol screening: AUDIT questionnaire indicates low-risk usage. -- family history of breast cancer screening: done. not at high risk. -- no evidence of domestic violence or intimate partner violence. -- STD screening: gonorrhea/chlamydia NAAT collected -- pap smear collected per ASCCP guidelines -- flu vaccine not due -- HPV vaccination series: unknown.   Patient will schedule annual exam in 1 year. Patient opts for POPs for contraception, with COCs after infant started on solid foods.   Will contact patient with results of Pap and Aptima swab.   Imagene Riches, CNM  04/01/2021 9:30 AM   04/01/2021 8:31 AM

## 2021-04-02 LAB — CERVICOVAGINAL ANCILLARY ONLY
Bacterial Vaginitis (gardnerella): POSITIVE — AB
Chlamydia: NEGATIVE
Comment: NEGATIVE
Comment: NEGATIVE
Comment: NEGATIVE
Comment: NORMAL
Neisseria Gonorrhea: NEGATIVE
Trichomonas: NEGATIVE

## 2021-04-05 ENCOUNTER — Other Ambulatory Visit: Payer: Self-pay | Admitting: Obstetrics

## 2021-04-05 ENCOUNTER — Encounter: Payer: Self-pay | Admitting: Obstetrics

## 2021-04-05 DIAGNOSIS — B9689 Other specified bacterial agents as the cause of diseases classified elsewhere: Secondary | ICD-10-CM

## 2021-04-05 MED ORDER — METRONIDAZOLE 500 MG PO TABS: 500.0000 mg | ORAL_TABLET | Freq: Two times a day (BID) | ORAL | 0 refills | Status: AC

## 2021-04-06 LAB — CYTOLOGY - PAP
Diagnosis: NEGATIVE
Diagnosis: REACTIVE

## 2022-04-24 IMAGING — US US OB COMP +14 WK
1 series · 13 of 28 positions shown · non-contrast
Comparison: none

CLINICAL DATA: Fetal anatomy evaluation

EXAM:
OBSTETRICAL ULTRASOUND >14 WKS

[Series 1: us ob comp + 14 wk · 13 of 93 slices shown]
[im 4/93]
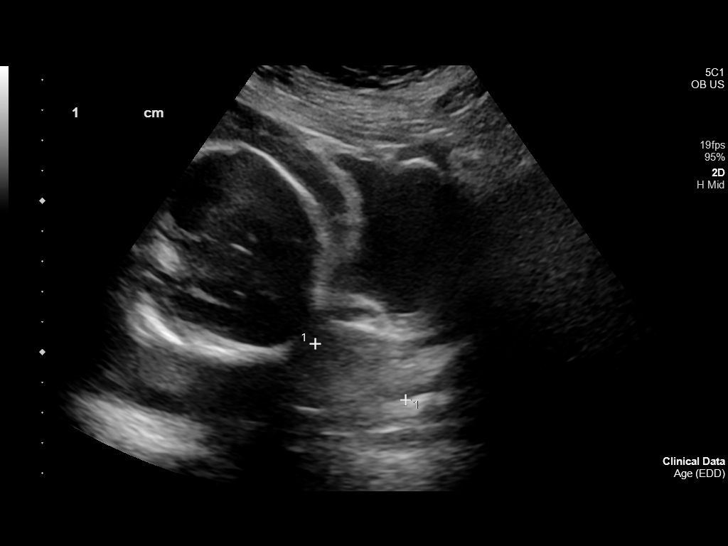
[im 11/93]
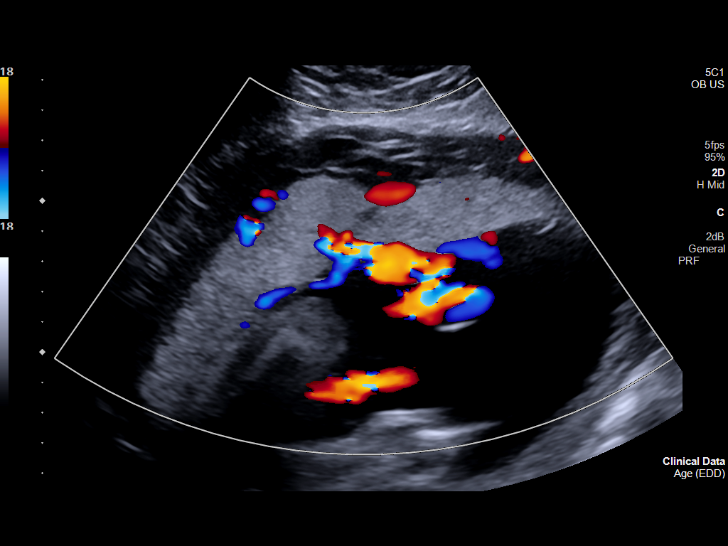
[im 18/93]
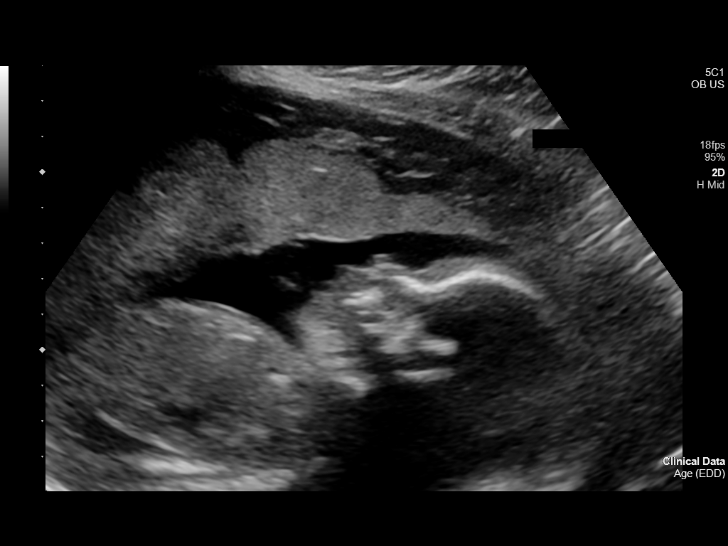
[im 24/93]
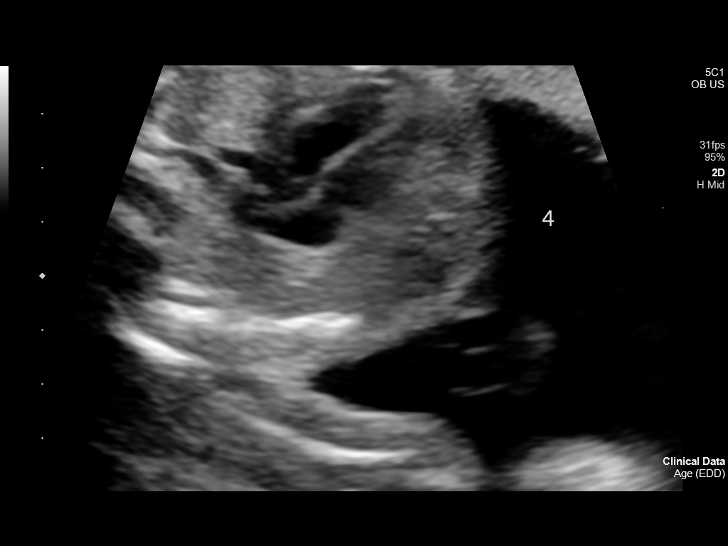
[im 31/93]
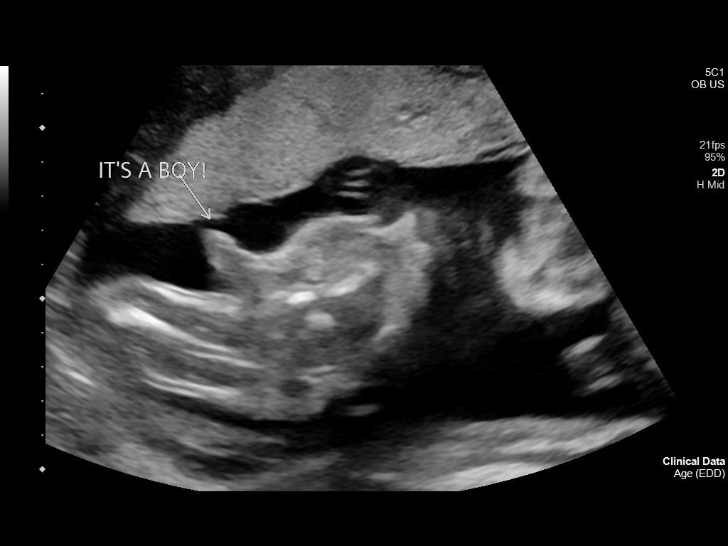
[im 38/93]
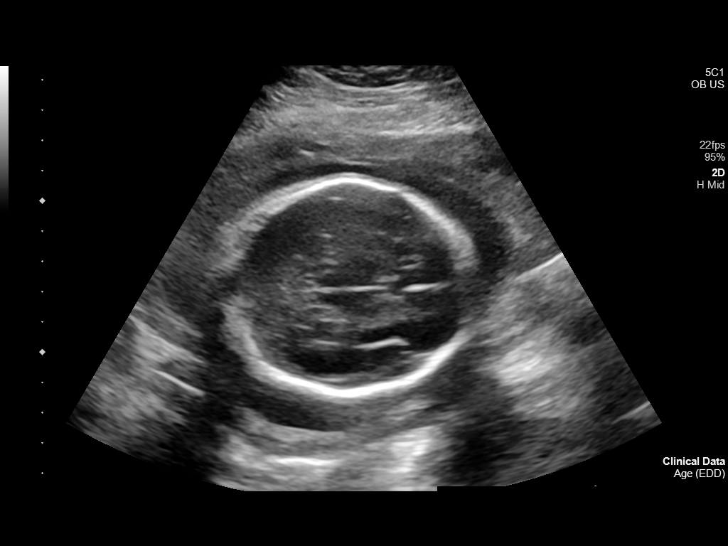
[im 48/93]
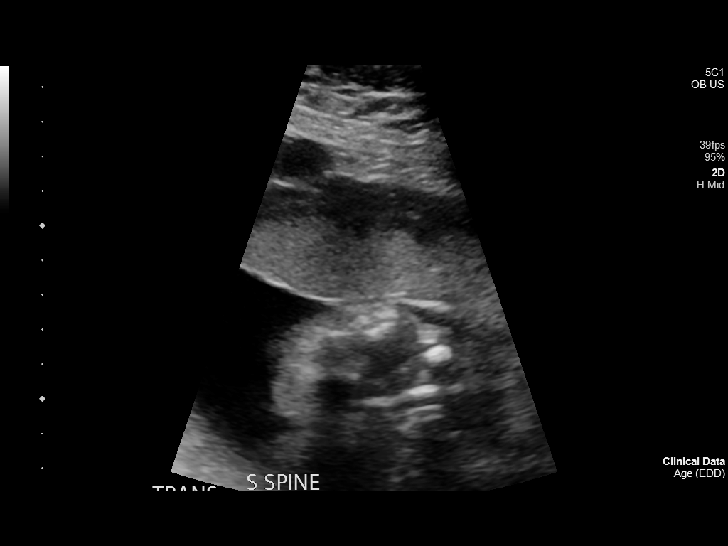
[im 55/93]
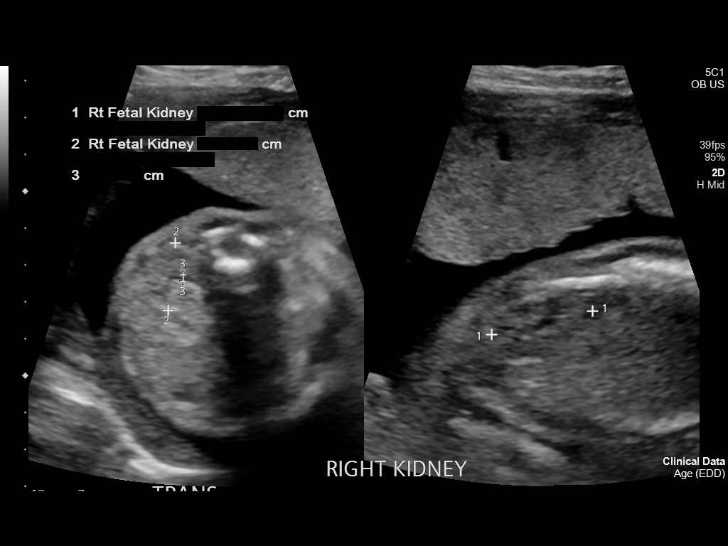
[im 62/93]
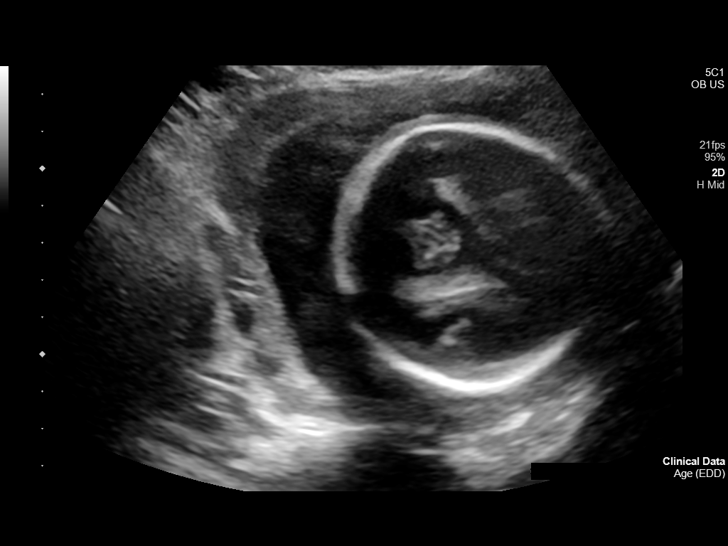
[im 69/93]
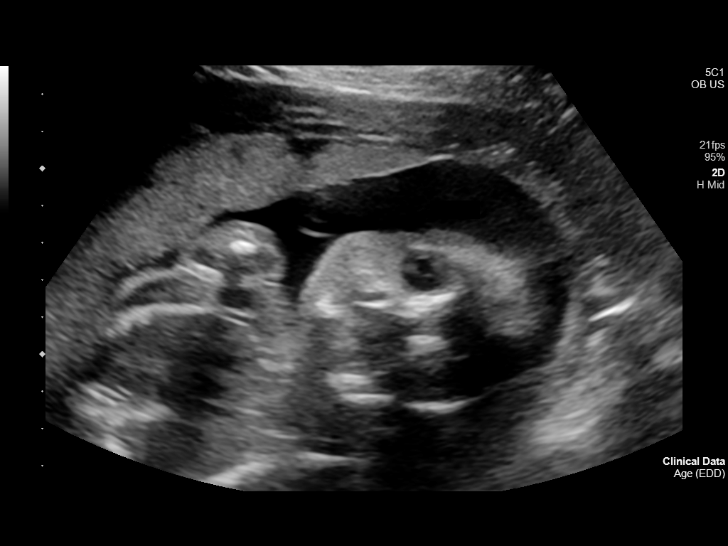
[im 75/93]
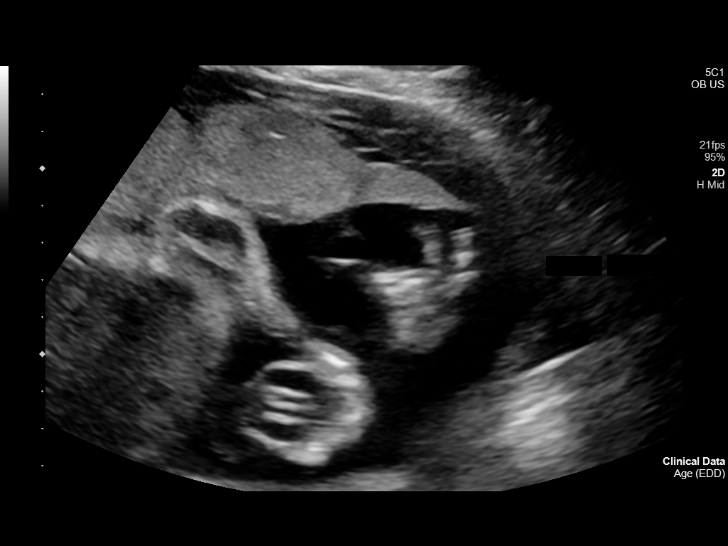
[im 82/93]
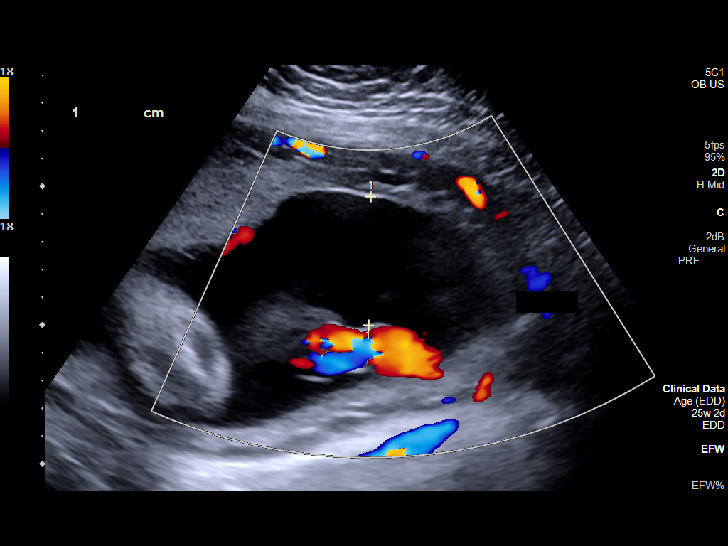
[im 89/93]
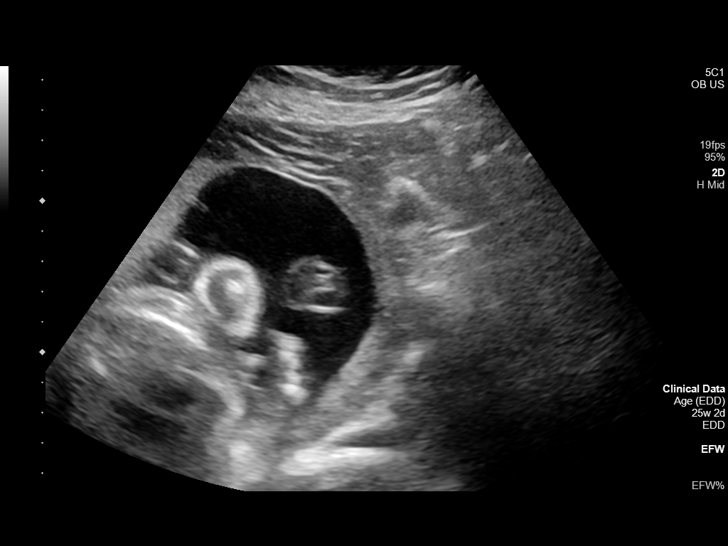

[13 of 28 positions shown; findings below may reference images not displayed]

FINDINGS: Number of Fetuses: 1

Heart Rate:  138 bpm

Movement: Yes

Presentation: Cephalic

Previa: No

Placental Location: Anterior

Amniotic Fluid (Subjective): Normal

Amniotic Fluid (Objective):

Vertical pocket = 4.6cm

FETAL BIOMETRY

BPD: 6.9cm 27w 4d

HC: 25.29cm  27w   3d

AC: 21.31cm  25w   6d

FL: 4.42cm  24w   4d

Current Mean GA: 26w 3d                     US EDC: 10/23/2020

Assigned GA:  25w 2d            Assigned EDC: 10/31/2020

FETAL ANATOMY

Lateral Ventricles: Appears normal

Thalami/CSP: Appears normal

Posterior Fossa:  Appears normal

Nuchal Region: Not visualized   NFT= N/A > 20 WKS

Upper Lip: Appears normal

Spine: Appears normal

4 Chamber Heart on Left: Appears normal

LVOT: Appears normal

RVOT: Appears normal

Stomach on Left: Appears normal

3 Vessel Cord: Appears normal

Cord Insertion site: Appears normal

Kidneys: Appears normal

Bladder: Appears normal

Extremities: Appears normal

Sex: Male

Technically difficult due to: None

Maternal Findings:

Cervix:  Cervical length 3.5 cm.  Cervix is closed.
IMPRESSION: 1. Single live intrauterine pregnancy as detailed above. No focal
fetal anatomic abnormality. Nuchal region is not visualized on the
current examination. If there is further clinical concern, recommend
a focused follow-up ultrasound for re-evaluation.
